# Patient Record
Sex: Male | Born: 1945 | Hispanic: Yes | Marital: Married | Smoking: Former smoker
Health system: Southern US, Community
[De-identification: ages and names within clinical notes are randomized; demographics above are authoritative.]

## PROBLEM LIST (undated history)

## (undated) HISTORY — PX: NO PAST SURGERIES: SHX2092

---

## 2019-01-04 DIAGNOSIS — I10 Essential (primary) hypertension: Secondary | ICD-10-CM | POA: Insufficient documentation

## 2019-01-04 DIAGNOSIS — E1165 Type 2 diabetes mellitus with hyperglycemia: Secondary | ICD-10-CM

## 2019-01-04 HISTORY — DX: Type 2 diabetes mellitus with hyperglycemia: E11.65

## 2019-01-04 HISTORY — DX: Essential (primary) hypertension: I10

## 2019-01-05 DIAGNOSIS — I2699 Other pulmonary embolism without acute cor pulmonale: Secondary | ICD-10-CM

## 2019-01-05 HISTORY — DX: Other pulmonary embolism without acute cor pulmonale: I26.99

## 2019-01-07 DIAGNOSIS — I4891 Unspecified atrial fibrillation: Secondary | ICD-10-CM

## 2019-01-07 DIAGNOSIS — I5020 Unspecified systolic (congestive) heart failure: Secondary | ICD-10-CM

## 2019-01-07 HISTORY — DX: Unspecified systolic (congestive) heart failure: I50.20

## 2019-01-07 HISTORY — DX: Unspecified atrial fibrillation: I48.91

## 2019-01-20 DIAGNOSIS — E7800 Pure hypercholesterolemia, unspecified: Secondary | ICD-10-CM | POA: Insufficient documentation

## 2019-01-20 DIAGNOSIS — R918 Other nonspecific abnormal finding of lung field: Secondary | ICD-10-CM | POA: Insufficient documentation

## 2019-01-20 DIAGNOSIS — I714 Abdominal aortic aneurysm, without rupture, unspecified: Secondary | ICD-10-CM

## 2019-01-20 HISTORY — DX: Abdominal aortic aneurysm, without rupture, unspecified: I71.40

## 2019-01-20 HISTORY — DX: Abdominal aortic aneurysm, without rupture: I71.4

## 2019-01-20 HISTORY — DX: Other nonspecific abnormal finding of lung field: R91.8

## 2019-01-20 HISTORY — DX: Pure hypercholesterolemia, unspecified: E78.00

## 2019-01-21 DIAGNOSIS — Z5989 Other problems related to housing and economic circumstances: Secondary | ICD-10-CM

## 2019-01-21 DIAGNOSIS — G4700 Insomnia, unspecified: Secondary | ICD-10-CM | POA: Insufficient documentation

## 2019-01-21 HISTORY — DX: Insomnia, unspecified: G47.00

## 2019-01-21 HISTORY — DX: Other problems related to housing and economic circumstances: Z59.89

## 2019-02-09 DIAGNOSIS — Q231 Congenital insufficiency of aortic valve: Secondary | ICD-10-CM | POA: Insufficient documentation

## 2019-02-09 DIAGNOSIS — I350 Nonrheumatic aortic (valve) stenosis: Secondary | ICD-10-CM | POA: Insufficient documentation

## 2019-02-09 HISTORY — DX: Nonrheumatic aortic (valve) stenosis: I35.0

## 2019-02-09 HISTORY — DX: Congenital insufficiency of aortic valve: Q23.1

## 2019-04-21 DIAGNOSIS — Z754 Unavailability and inaccessibility of other helping agencies: Secondary | ICD-10-CM

## 2019-04-21 HISTORY — DX: Unavailability and inaccessibility of other helping agencies: Z75.4

## 2019-06-18 DIAGNOSIS — R600 Localized edema: Secondary | ICD-10-CM

## 2019-06-18 DIAGNOSIS — I509 Heart failure, unspecified: Secondary | ICD-10-CM

## 2019-10-27 DIAGNOSIS — J9601 Acute respiratory failure with hypoxia: Secondary | ICD-10-CM | POA: Insufficient documentation

## 2019-10-27 HISTORY — DX: Acute respiratory failure with hypoxia: J96.01

## 2019-10-29 DIAGNOSIS — I509 Heart failure, unspecified: Secondary | ICD-10-CM

## 2019-10-29 DIAGNOSIS — I160 Hypertensive urgency: Secondary | ICD-10-CM | POA: Insufficient documentation

## 2019-10-29 HISTORY — DX: Heart failure, unspecified: I50.9

## 2019-10-29 HISTORY — DX: Hypertensive urgency: I16.0

## 2020-04-25 ENCOUNTER — Telehealth: Payer: Self-pay | Admitting: Podiatry

## 2020-05-22 ENCOUNTER — Ambulatory Visit: Payer: 59 | Admitting: Cardiovascular Disease

## 2020-05-26 ENCOUNTER — Other Ambulatory Visit: Payer: Self-pay

## 2020-05-26 ENCOUNTER — Ambulatory Visit (INDEPENDENT_AMBULATORY_CARE_PROVIDER_SITE_OTHER): Payer: 59

## 2020-05-26 ENCOUNTER — Ambulatory Visit (INDEPENDENT_AMBULATORY_CARE_PROVIDER_SITE_OTHER): Payer: 59 | Admitting: Podiatry

## 2020-05-26 DIAGNOSIS — Q828 Other specified congenital malformations of skin: Secondary | ICD-10-CM | POA: Diagnosis not present

## 2020-05-26 DIAGNOSIS — L989 Disorder of the skin and subcutaneous tissue, unspecified: Secondary | ICD-10-CM | POA: Diagnosis not present

## 2020-05-26 DIAGNOSIS — M79671 Pain in right foot: Secondary | ICD-10-CM | POA: Diagnosis not present

## 2020-05-26 DIAGNOSIS — R609 Edema, unspecified: Secondary | ICD-10-CM | POA: Diagnosis not present

## 2020-05-26 DIAGNOSIS — M79672 Pain in left foot: Secondary | ICD-10-CM

## 2020-05-26 NOTE — Progress Notes (Signed)
Subjective:   Patient ID: Derek Schroeder, male   DOB: 74 y.o.   MRN: [id number]   HPI 74 year old male presents the office today with his wife as well as an interpreter via iPad for concerns of multiple skin lesions on both of his feet which is been ongoing for several years. He states that the areas will be trimmed and then come back. When he was living in [place] he had some kind of injection performed but not sure of what. He said no recent treatment. He is diabetic but does not check his blood sugar at home. He is also on Eliquis currently. He has had some swelling to the left foot for quite some time without any associated pain. No injury. He did see his cardiologist for this.   Review of Systems  All other systems reviewed and are negative.  No past medical history on file.  Current Outpatient Medications:  .  esomeprazole (NEXIUM) 20 MG capsule, Take by mouth., Disp: , Rfl:  .  hydrALAZINE (APRESOLINE) 25 MG tablet, TOME 2 TABLETAS POR LA BOCA TRES VECES AL DIA, Disp: , Rfl:  .  metFORMIN (GLUCOPHAGE) 1000 MG tablet, TOME 1 TABLETA POR LA BOCA 2 VECES AL DIA CON EL DESAYUNO Y LA CENA., Disp: , Rfl:  .  spironolactone (ALDACTONE) 25 MG tablet, TOME 1/2 TABLETA POR LA BOCA DIARIA., Disp: , Rfl:  .  amLODipine (NORVASC) 5 MG tablet, Take 5 mg by mouth daily., Disp: , Rfl:  .  atorvastatin (LIPITOR) 40 MG tablet, Take 40 mg by mouth daily., Disp: , Rfl:  .  carvedilol (COREG) 12.5 MG tablet, Take 12.5 mg by mouth 2 (two) times daily., Disp: , Rfl:  .  ELIQUIS 5 MG TABS tablet, Take 5 mg by mouth 2 (two) times daily., Disp: , Rfl:  .  gabapentin (NEURONTIN) 300 MG capsule, Take by mouth., Disp: , Rfl:  .  hydrOXYzine (ATARAX/VISTARIL) 25 MG tablet, Take 25 mg by mouth 2 (two) times daily., Disp: , Rfl:  .  loratadine (CLARITIN) 10 MG tablet, Take 10 mg by mouth daily., Disp: , Rfl:  .  losartan (COZAAR) 50 MG tablet, Take 100 mg by mouth daily., Disp: , Rfl:  .  Potassium Chloride  ER 20 MEQ TBCR, Take 1 tablet by mouth 2 (two) times daily., Disp: , Rfl:  .  traZODone (DESYREL) 100 MG tablet, Take 100 mg by mouth at bedtime., Disp: , Rfl:   No Known Allergies       Objective:  Physical Exam  General: AAO x3, NAD  Dermatological: Multiple, small, punctate lesions present lateral aspects of bilateral feet as well as left heel. Upon debridement there is no underlying ulceration drainage or signs of infection. There is about 3 lesions on the right foot and 4 on the left foot. There is no underlying evidence of foreign body, drainage or pus. There is does not appear to be a verruca currently.  Vascular: Dorsalis Pedis artery and Posterior Tibial artery pedal pulses are 2/4 bilateral with immedate capillary fill time. There is no pain with calf compression, swelling, warmth, erythema.   Neruologic: Grossly intact via light touch bilateral.   Musculoskeletal: Mild tenderness to hyperkeratotic lesions but no other areas of tenderness. There is no pain associated with swelling of the foot. Muscular strength 5/5 in all groups tested bilateral.  Gait: Unassisted, Nonantalgic.       Assessment:   Porokeratosis, skin lesions bilaterally; left foot swelling     Plan:  -  Treatment options discussed including all alternatives, risks, and complications -Etiology of symptoms were discussed -Lesions were debrided without any complications or bleeding. I recommended moisturizer dispensed miracle foot cream. -Discussed importance of daily foot inspection as well as to check his glucose at home. -X-rays ordered of left foot.  Vivi Barrack DPM

## 2020-06-07 NOTE — Telephone Encounter (Signed)
DONE

## 2020-06-09 ENCOUNTER — Other Ambulatory Visit: Payer: Self-pay

## 2020-06-09 ENCOUNTER — Encounter: Payer: Self-pay | Admitting: Cardiology

## 2020-06-09 ENCOUNTER — Encounter: Payer: Self-pay | Admitting: *Deleted

## 2020-06-09 ENCOUNTER — Ambulatory Visit (INDEPENDENT_AMBULATORY_CARE_PROVIDER_SITE_OTHER): Payer: 59 | Admitting: Cardiology

## 2020-06-09 VITALS — BP 120/70 | HR 67 | Ht 66.0 in | Wt 227.0 lb

## 2020-06-09 DIAGNOSIS — I4891 Unspecified atrial fibrillation: Secondary | ICD-10-CM

## 2020-06-09 DIAGNOSIS — I4821 Permanent atrial fibrillation: Secondary | ICD-10-CM

## 2020-06-09 DIAGNOSIS — I5033 Acute on chronic diastolic (congestive) heart failure: Secondary | ICD-10-CM | POA: Diagnosis not present

## 2020-06-09 DIAGNOSIS — E1165 Type 2 diabetes mellitus with hyperglycemia: Secondary | ICD-10-CM | POA: Diagnosis not present

## 2020-06-09 DIAGNOSIS — E669 Obesity, unspecified: Secondary | ICD-10-CM

## 2020-06-09 DIAGNOSIS — I714 Abdominal aortic aneurysm, without rupture, unspecified: Secondary | ICD-10-CM

## 2020-06-09 HISTORY — DX: Permanent atrial fibrillation: I48.21

## 2020-06-09 HISTORY — DX: Obesity, unspecified: E66.9

## 2020-06-09 NOTE — Patient Instructions (Addendum)
Medication Instructions:  No medication changes. *If you need a refill on your cardiac medications before your next appointment, please call your pharmacy*   Lab Work: None ordered If you have labs (blood work) drawn today and your tests are completely normal, you will receive your results only by: Marland Kitchen MyChart Message (if you have MyChart) OR . A paper copy in the mail If you have any lab test that is abnormal or we need to change your treatment, we will call you to review the results.   Testing/Procedures: Your physician has requested that you have an abdominal aorta duplex. During this test, an ultrasound is used to evaluate the aorta. Allow 30 minutes for this exam. Do not eat after midnight the day before and avoid carbonated beverages   Follow-Up: At [facility], you and your health needs are our priority.  As part of our continuing mission to provide you with exceptional heart care, we have created designated Provider Care Teams.  These Care Teams include your primary Cardiologist (physician) and Advanced Practice Providers (APPs -  Physician Assistants and Nurse Practitioners) who all work together to provide you with the care you need, when you need it.  We recommend signing up for the patient portal called "MyChart".  Sign up information is provided on this After Visit Summary.  MyChart is used to connect with patients for Virtual Visits (Telemedicine).  Patients are able to view lab/test results, encounter notes, upcoming appointments, etc.  Non-urgent messages can be sent to your provider as well.   To learn more about what you can do with MyChart, go to [email or web address].    Your next appointment:   6 month(s)  The format for your next appointment:   In Person  Provider:   Belva Crome, MD   Other Instructions

## 2020-06-09 NOTE — Progress Notes (Signed)
Cardiology Office Note:    Date:  06/09/2020   ID:  Derek Schroeder, DOB [birth year], MRN [id number]  PCP:  Nechama Guard, FNP  Cardiologist:  Garwin Brothers, MD   Referring MD: Nechama Guard, FNP    ASSESSMENT:    1. Atrial fibrillation, unspecified type (HCC)   2. Permanent atrial fibrillation (HCC)   3. Acute on chronic diastolic congestive heart failure (HCC)   4. Type 2 diabetes mellitus with hyperglycemia, without long-term current use of insulin (HCC)   5. Abdominal aortic aneurysm (AAA) without rupture (HCC)   6. Obesity (BMI 35.0-39.9 without comorbidity)    PLAN:    In order of problems listed above:  1. Primary prevention stressed with patient.  Importance of compliance with diet medication stressed any vocalized understanding. 2. Permanent atrial fibrillation:I discussed with the patient atrial fibrillation, disease process. Management and therapy including rate and rhythm control, anticoagulation benefits and potential risks were discussed extensively with the patient. Patient had multiple questions which were answered to patient's satisfaction. 3. Preserved systolic function heart failure: Patient is on appropriate medication and he is monitoring his weight very regularly.  Salt intake issues and regular weight checks were emphasized.  Patient had official interpreter who translated all these messages.  His wife accompanies him to visit and had multiple questions which were answered to satisfaction. 4. Essential hypertension: Blood pressure is stable 5. Mixed dyslipidemia: Diet was emphasized.  Weight reduction was stressed.  Lipids were reviewed.  This will be followed by his primary care physician. 6. Abdominal aortic aneurysm: Records reviewed and we will do a Doppler to assess and follow-up on this finding.  I discussed this with him at length and questions were answered to satisfaction. 7. Patient will be seen in follow-up appointment in 6 months or  earlier if the patient has any concerns    Medication Adjustments/Labs and Tests Ordered: Current medicines are reviewed at length with the patient today.  Concerns regarding medicines are outlined above.  No orders of the defined types were placed in this encounter.  No orders of the defined types were placed in this encounter.    History of Present Illness:    Derek Schroeder is a 74 y.o. male who is being seen today for the evaluation of history of congestive heart failure and atrial fibrillation at the request of Ntibrey, Mabeline Caras, FNP.Marland Kitchen  Patient mentions to me the history of congestive heart failure.  His echocardiogram done fairly recently was unremarkable.  He has permanent atrial fibrillation is on anticoagulation.  His abdominal aneurysm of the aorta and hypertension and mixed dyslipidemia.  He is obese.  He leads a sedentary lifestyle.  No chest pain orthopnea or PND.  For insurance reason he has transferred to our care from the [facility] cardiology group.  At the time of my evaluation, the patient is alert awake oriented and in no distress.  Past Medical History:  Diagnosis Date  . Abdominal aortic aneurysm (AAA) without rupture (HCC) 01/20/2019  . Acute hypoxemic respiratory failure (HCC) 10/27/2019  . Atrial fibrillation (HCC) 01/07/2019  . Bicuspid aortic valve 02/09/2019  . CHF exacerbation (HCC) 10/29/2019  . HTN (hypertension) 01/04/2019  . Hypertensive urgency 10/29/2019  . Insomnia 01/21/2019  . 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 Pulmonary embolism on right (HCC) 01/05/2019  . Pulmonary nodules 01/20/2019  . Pure hypercholesterolemia 01/20/2019  . Systolic heart failure (HCC) 01/07/2019  . Type 2 diabetes mellitus with hyperglycemia, without long-term current use  of insulin (HCC) 01/04/2019    Current Medications: Current Meds  Medication Sig  . amLODipine (NORVASC) 5 MG tablet Take 5 mg by mouth daily.  Marland Kitchen atorvastatin (LIPITOR) 40 MG tablet Take 40 mg by  mouth daily.  . carvedilol (COREG) 12.5 MG tablet Take 12.5 mg by mouth 2 (two) times daily.  Marland Kitchen ELIQUIS 5 MG TABS tablet Take 5 mg by mouth 2 (two) times daily.  Marland Kitchen esomeprazole (NEXIUM) 20 MG capsule Take by mouth.  . furosemide (LASIX) 80 MG tablet Take 80 mg by mouth 2 (two) times daily.  Marland Kitchen losartan (COZAAR) 50 MG tablet Take 100 mg by mouth daily.  Marland Kitchen MELATONIN GUMMIES PO Take by mouth at bedtime as needed.  . metFORMIN (GLUCOPHAGE) 1000 MG tablet Take 1,000 mg by mouth 2 (two) times daily with a meal.   . Potassium Chloride ER 20 MEQ TBCR Take 1 tablet by mouth 2 (two) times daily.     Allergies:   Patient has no known allergies.   Social History   Socioeconomic History  . Marital status: Married    Spouse name: Not on file  . Number of children: Not on file  . Years of education: Not on file  . Highest education level: Not on file  Occupational History  . Not on file  Tobacco Use  . Smoking status: Former Smoker    Types: Cigarettes    Quit date: 06/09/2016    Years since quitting: 4.0  . Smokeless tobacco: Never Used  Substance and Sexual Activity  . Alcohol use: Never  . Drug use: Never  . Sexual activity: Not on file  Other Topics Concern  . Not on file  Social History Narrative  . Not on file   Social Determinants of Health   Financial Resource Strain:   . Difficulty of Paying Living Expenses: Not on file  Food Insecurity:   . Worried About Programme researcher, broadcasting/film/video in the Last Year: Not on file  . Ran Out of Food in the Last Year: Not on file  Transportation Needs:   . Lack of Transportation (Medical): Not on file  . Lack of Transportation (Non-Medical): Not on file  Physical Activity:   . Days of Exercise per Week: Not on file  . Minutes of Exercise per Session: Not on file  Stress:   . Feeling of Stress : Not on file  Social Connections:   . Frequency of Communication with Friends and Family: Not on file  . Frequency of Social Gatherings with Friends and  Family: Not on file  . Attends Religious Services: Not on file  . Active Member of Clubs or Organizations: Not on file  . Attends Banker Meetings: Not on file  . Marital Status: Not on file     Family History: The patient's family history includes Diabetes in his father.  ROS:   Please see the history of present illness.    All other systems reviewed and are negative.  EKGs/Labs/Other Studies Reviewed:    The following studies were reviewed today: SUMMARY  Aortic valve calcification  The left ventricular wall motion is normal.  LV ejection fraction = 55-60%.  Mild left ventricular hypertrophy  There is mild to moderate aortic regurgitation.  There is mild mitral regurgitation.  There is mild tricuspid regurgitation.  Mild pulmonary hypertension.  There is no significant change in comparison with the last study  report 06/08/2019     Recent Labs: No results found for requested  labs within last 8760 hours.  Recent Lipid Panel No results found for: CHOL, TRIG, HDL, CHOLHDL, VLDL, LDLCALC, LDLDIRECT  Physical Exam:    VS:  BP 120/70 (BP Location: Right Arm, Patient Position: Sitting, Cuff Size: Normal)   Pulse 67   Ht 5\' 6"  (1.676 m)   Wt 227 lb (103 kg)   SpO2 97%   BMI 36.64 kg/m     Wt Readings from Last 3 Encounters:  06/09/20 227 lb (103 kg)     GEN: Patient is in no acute distress HEENT: Normal NECK: No JVD; No carotid bruits LYMPHATICS: No lymphadenopathy CARDIAC: S1 S2 regular, 2/6 systolic murmur at the apex. RESPIRATORY:  Clear to auscultation without rales, wheezing or rhonchi  ABDOMEN: Soft, non-tender, non-distended MUSCULOSKELETAL:  No edema; No deformity  SKIN: Warm and dry NEUROLOGIC:  Alert and oriented x 3 PSYCHIATRIC:  Normal affect    Signed, 08/09/20, MD  06/09/2020 10:30 AM    [facility]

## 2020-06-14 ENCOUNTER — Telehealth: Payer: Self-pay

## 2020-06-14 NOTE — Telephone Encounter (Signed)
Pt assistance completed for Eliquis.

## 2020-07-03 ENCOUNTER — Ambulatory Visit (HOSPITAL_BASED_OUTPATIENT_CLINIC_OR_DEPARTMENT_OTHER): Payer: 59

## 2020-08-19 DIAGNOSIS — I4811 Longstanding persistent atrial fibrillation: Secondary | ICD-10-CM

## 2020-08-19 DIAGNOSIS — J208 Acute bronchitis due to other specified organisms: Secondary | ICD-10-CM

## 2020-08-19 DIAGNOSIS — I10 Essential (primary) hypertension: Secondary | ICD-10-CM | POA: Insufficient documentation

## 2020-08-19 DIAGNOSIS — I5032 Chronic diastolic (congestive) heart failure: Secondary | ICD-10-CM

## 2020-08-19 DIAGNOSIS — B9781 Human metapneumovirus as the cause of diseases classified elsewhere: Secondary | ICD-10-CM

## 2020-08-19 HISTORY — DX: Chronic diastolic (congestive) heart failure: I50.32

## 2020-08-19 HISTORY — DX: Longstanding persistent atrial fibrillation: I48.11

## 2020-08-19 HISTORY — DX: Essential (primary) hypertension: I10

## 2020-08-19 HISTORY — DX: Acute bronchitis due to other specified organisms: J20.8

## 2020-08-19 HISTORY — DX: Human metapneumovirus as the cause of diseases classified elsewhere: B97.81

## 2020-12-12 ENCOUNTER — Ambulatory Visit: Payer: 59 | Admitting: Cardiology

## 2020-12-19 ENCOUNTER — Encounter: Payer: Self-pay | Admitting: Cardiology

## 2020-12-29 ENCOUNTER — Ambulatory Visit (INDEPENDENT_AMBULATORY_CARE_PROVIDER_SITE_OTHER): Payer: 59 | Admitting: Podiatry

## 2020-12-29 ENCOUNTER — Encounter: Payer: Self-pay | Admitting: Podiatry

## 2020-12-29 ENCOUNTER — Other Ambulatory Visit: Payer: Self-pay

## 2020-12-29 DIAGNOSIS — M79671 Pain in right foot: Secondary | ICD-10-CM | POA: Diagnosis not present

## 2020-12-29 DIAGNOSIS — Q828 Other specified congenital malformations of skin: Secondary | ICD-10-CM | POA: Diagnosis not present

## 2020-12-29 DIAGNOSIS — M79672 Pain in left foot: Secondary | ICD-10-CM

## 2020-12-29 DIAGNOSIS — Z7901 Long term (current) use of anticoagulants: Secondary | ICD-10-CM | POA: Diagnosis not present

## 2021-01-02 NOTE — Progress Notes (Signed)
Subjective: 75 year old male presents the office with interpreter for evaluation of calluses.  He states that that miracle foot cream has been working very well and he has been doing great up into the last month and the calluses started to come back causing discomfort.  Denies any open lesions. Denies any systemic complaints such as fevers, chills, nausea, vomiting. No acute changes since last appointment, and no other complaints at this time.   He is currently taking Eliquis  Objective: AAO x3, NAD DP/PT pulses palpable bilaterally, CRT less than 3 seconds Hyperkeratotic lesions noted on both the left and right foot.  There are multiple small annular hyperkeratotic lesions there are 3 lesions on each foot.  Upon debridement very minimal amount of bleeding occurred on the left plantar lateral lesion.  There is no edema no erythema or signs of infection. No pain with calf compression, swelling, warmth, erythema  Assessment: Hyperkeratotic lesions, currently on Eliquis  Plan: -All treatment options discussed with the patient including all alternatives, risks, complications.  -Sharply debrided the hyperkeratotic lesions x6.  Small mount of bleeding occurred on the left side.  The area was cleaned and antibiotic ointment was applied.  Monitor for any signs or symptoms of infection.  Continue offloading as well as moisturizer.  Apply the moisturizer once the area of the left side is completely healed. -Patient encouraged to call the office with any questions, concerns, change in symptoms.   Vivi Barrack DPM

## 2021-02-08 ENCOUNTER — Ambulatory Visit: Payer: 59 | Admitting: Cardiology

## 2021-02-08 ENCOUNTER — Encounter: Payer: Self-pay | Admitting: Cardiology

## 2021-02-08 ENCOUNTER — Telehealth: Payer: Self-pay | Admitting: General Practice

## 2021-02-08 ENCOUNTER — Other Ambulatory Visit: Payer: Self-pay

## 2021-02-08 VITALS — BP 126/74 | HR 74 | Ht 67.0 in | Wt 221.1 lb

## 2021-02-08 DIAGNOSIS — E669 Obesity, unspecified: Secondary | ICD-10-CM | POA: Diagnosis not present

## 2021-02-08 DIAGNOSIS — I714 Abdominal aortic aneurysm, without rupture, unspecified: Secondary | ICD-10-CM

## 2021-02-08 DIAGNOSIS — R0600 Dyspnea, unspecified: Secondary | ICD-10-CM

## 2021-02-08 DIAGNOSIS — I4821 Permanent atrial fibrillation: Secondary | ICD-10-CM | POA: Diagnosis not present

## 2021-02-08 DIAGNOSIS — E1165 Type 2 diabetes mellitus with hyperglycemia: Secondary | ICD-10-CM | POA: Diagnosis not present

## 2021-02-08 DIAGNOSIS — R0609 Other forms of dyspnea: Secondary | ICD-10-CM

## 2021-02-08 HISTORY — DX: Dyspnea, unspecified: R06.00

## 2021-02-08 HISTORY — DX: Other forms of dyspnea: R06.09

## 2021-02-08 NOTE — Patient Instructions (Addendum)
Medication Instructions:  Your physician recommends that you continue on your current medications as directed. Please refer to the Current Medication list given to you today.  *If you need a refill on your cardiac medications before your next appointment, please call your pharmacy*   Lab Work: Your physician recommends that you have a BMET done today in the office.  If you have labs (blood work) drawn today and your tests are completely normal, you will receive your results only by: Marland Kitchen MyChart Message (if you have MyChart) OR . A paper copy in the mail If you have any lab test that is abnormal or we need to change your treatment, we will call you to review the results.   Testing/Procedures: Your physician has requested that you have a lexiscan myoview. For further information please visit [email or web address] Please follow instruction sheet, as given.  The test will take approximately 3 to 4 hours to complete; you may bring reading material.  If someone comes with you to your appointment, they will need to remain in the main lobby due to limited space in the testing area.  How to prepare for your Myocardial Perfusion Test: . Do not eat or drink 3 hours prior to your test, except you may have water. . Do not consume products containing caffeine (regular or decaffeinated) 12 hours prior to your test. (ex: coffee, chocolate, sodas, tea). . Do bring a list of your current medications with you.  If not listed below, you may take your medications as normal. . Do wear comfortable clothes (no dresses or overalls) and walking shoes, tennis shoes preferred (No heels or open toe shoes are allowed). . Do NOT wear cologne, perfume, aftershave, or lotions (deodorant is allowed). . If these instructions are not followed, your test will have to be rescheduled.   Su mdico ha solicitado que se Airline pilot. Para obtener ms informacin, visite [email or web address] Siga la hoja de  instrucciones, tal como se indica.  La prueba tardar aproximadamente de 3 a 4 horas en completarse; puede traer material de lectura. Si alguien lo acompaa a su cita, Education officer, environmental en el vestbulo principal debido al espacio limitado en el rea de prueba.   Cmo prepararse para su prueba de perfusin miocrdica: . No coma ni beba 3 horas antes de su prueba, excepto que tenga agua. . No consuma productos que contengan cafena (regular o descafeinada) 12 horas antes de su prueba. (Ej: caf, chocolate, refrescos, t). . Do bring a list of your current medications with you.  If not listed below, you may take your medications as normal. "Traiga una lista de sus medicamentos actuales. Si no se encuentran en la lista a continuacin, puede tomar sus medicamentos con normalidad". . Use ropa cmoda (sin vestidos ni overoles) y zapatos para caminar, preferiblemente tenis (no se permiten tacones ni zapatos abiertos). . NO use colonia, perfume, locin para despus del afeitado o lociones (se permite el desodorante). . Si no se siguen estas instrucciones, su prueba tendr que ser reprogramada.  Your physician has requested that you have an echocardiogram. Echocardiography is a painless test that uses sound waves to create images of your heart. It provides your doctor with information about the size and shape of your heart and how well your heart's chambers and valves are working. This procedure takes approximately one hour. There are no restrictions for this procedure.  Su mdico ha solicitado que se Engineer, manufacturing. La ecocardiografa es una prueba indolora que [place] ondas de sonido  para crear imgenes de su corazn. Le brinda a su mdico informacin sobre el tamao y la forma de su corazn y qu tan bien estn funcionando las cmaras y vlvulas de su corazn. Este procedimiento dura aproximadamente una hora. No hay restricciones para este procedimiento.  Follow-Up: At [facility], you and your  health needs are our priority.  As part of our continuing mission to provide you with exceptional heart care, we have created designated Provider Care Teams.  These Care Teams include your primary Cardiologist (physician) and Advanced Practice Providers (APPs -  Physician Assistants and Nurse Practitioners) who all work together to provide you with the care you need, when you need it.  We recommend signing up for the patient portal called "MyChart".  Sign up information is provided on this After Visit Summary.  MyChart is used to connect with patients for Virtual Visits (Telemedicine).  Patients are able to view lab/test results, encounter notes, upcoming appointments, etc.  Non-urgent messages can be sent to your provider as well.   To learn more about what you can do with MyChart, go to [email or web address].    Your next appointment:   3 month(s)  The format for your next appointment:   In Person  Provider:   Belva Crome, MD   Other Instructions   Ecocardiograma Echocardiogram Un ecocardiograma es una prueba que [place] ondas sonoras (ecografa) para obtener imgenes del corazn. Las imgenes de un ecocardiograma pueden proporcionar informacin importante sobre lo siguiente:  Tamao y forma del corazn.  El [place], el grosor y el movimiento de las paredes del Programmer, multimedia.  La funcin y la fuerza del msculo cardaco.  La funcin de la vlvula cardaca o si tiene estenosis. Se presenta estenosis cuando las vlvulas cardacas son demasiado estrechas.  Si la sangre [employer] atrs a travs de las vlvulas cardacas (regurgitacin).  Un tumor o crecimiento infeccioso alrededor de las vlvulas cardacas.  reas del msculo cardaco que no funcionan bien debido a un flujo sanguneo deficiente o a una lesin a causa de un infarto de miocardio.  Signos de aneurisma. Un aneurisma es una parte debilitada o daada en la pared de una arteria. La pared se abulta debido a la fuerza normal  que produce el bombeo de la sangre a travs del cuerpo. Informe al mdico acerca de lo siguiente:  Cualquier alergia que tenga.  Todos los [employer] [place], incluidos vitaminas, hierbas, gotas oftlmicas, cremas y [address] de [address].  Cualquier trastorno de la sangre que tenga.  Cirugas a las que se haya sometido.  Cualquier afeccin mdica que tenga.  Si est embarazada o podra estarlo. Cules son los riesgos? Por lo general, se trata de una prueba segura. Sin embargo, pueden presentarse problemas, como una reaccin alrgica al tinte Investment banker, operational) que se puede usar durante la prueba. Qu ocurre antes de esta prueba? No se requiere Lobbyist. Podr comer y beber normalmente. Qu ocurre durante la prueba?  Se quitar la ropa de la cintura para [place] y se pondr una bata de hospital.  Es posible que le coloquen electrodos oparches de electrocardiograma (ECG) en el pecho. Luego los electrodos o parches se conectan a un dispositivo que controla su ritmo y frecuencia cardaca.  Se acostar sobre una mesa para que le realicen una ecografa. Se le aplicar un gel en el pecho para ayudar a que las ondas sonoras pasen a travs de la piel.  Se presionar un dispositivo de mano, llamado transductor, contra el pecho y se  mover sobre su corazn. El transductor produce ondas sonoras que viajan hasta el corazn y rebotan (o producen "eco") Air cabin crewhasta el transductor. Estas ondas sonoras se captarn en tiempo real y se transformarn en imgenes del corazn que se pueden ver en un monitor de vdeo. Las imgenes se grabarn en una computadora y sern revisadas por el mdico.  Podrn solicitarle que cambie de posicin o que contenga la respiracin durante un lapso breve. Esto hace que sea ms fcil obtener diferentes vistas o mejores vistas del corazn.  En algunos casos, puede recibir Lincolndalecontraste a travs de una va intravenosa (i.v). en una de las venas. Esto puede mejorar la  calidad de las imgenes del corazn. El procedimiento puede variar segn el mdico y el hospital.   Qu puedo esperar despus de la prueba? Puede retomar su rutina diaria normal, incluidos la dieta, las actividades y Pulte Homeslos medicamentos, a menos que su mdico le indique lo contrario. Siga estas instrucciones en su casa:  Recoger los Norfolk Southernresultados de la prueba es su responsabilidad. Consulte al mdico o pregunte en el departamento donde se realiza la prueba cundo estarn Hexion Specialty Chemicalslistos los resultados.  Cumpla con todas las visitas de seguimiento. Esto es importante. Resumen  Un ecocardiograma es una prueba que [place] ondas sonoras (ecografa) para obtener imgenes del corazn.  Las Leggett & Plattimgenes de un ecocardiograma pueden brindar informacin importante sobre el tamao y la forma del corazn, la funcin del msculo cardaco, la funcin de la vlvula cardaca y otros posibles problemas cardacos.  No es necesario prepararse para esta prueba. Podr comer y beber normalmente.  Al finalizar el ecocardiograma, puede retomar su rutina diaria normal, a menos que su mdico le indique lo contrario. Esta informacin no tiene Theme park managercomo fin reemplazar el consejo del mdico. Asegrese de hacerle al mdico cualquier pregunta que tenga. Document Revised: 06/22/2020 Document Reviewed: 06/22/2020 Elsevier Patient Education  2021 Elsevier Inc.  DravosburgGammagrafa cardaca Cardiac Nuclear Scan Kerby LessUna gammagrafa cardaca es una prueba que se realiza para verificar el flujo de sangre hacia el corazn. Se realiza cuando est en reposo y cuando hace ejercicio. La prueba puede detectar si:  No llega suficiente sangre a una regin del corazn.  El msculo cardaco no funciona como debera. Puede ser Temple-Inlandnecesario que le hagan esta prueba si:  Shelle Ironiene una enfermedad cardaca.  Ha tenido resultados de laboratorio que no son normales.  Ha tenido una ciruga cardaca o un procedimiento de baln para abrir las arterias obstruidas  (angioplastia).  Siente dolor en el pecho.  Le falta el aire. Para esta prueba, se pone un tinte especial (marcador) en el torrente sanguneo. El Holiday representativemarcador llegar hasta el corazn. Luego una cmara tomar imgenes del corazn para ver cmo se Engineer, technical salesdesplaza el marcador a travs del corazn. Generalmente, esta prueba se realiza en un hospital y [place] entre 2 y 4horas. Informe al mdico acerca de:  Cualquier alergia que tenga.  Todos los Walt Disneymedicamentos que utiliza, incluidos vitaminas, hierbas, gotas oftlmicas, cremas y [address]icamentos de [address] libre.  Cualquier problema previo que usted o algn miembro de su familia haya tenido con los anestsicos.  Cualquier trastorno de la sangre que tenga.  Cirugas a las que se haya sometido.  Cualquier afeccin mdica que tenga.  Si est embarazada o podra estarlo. Cules son los riesgos? Por lo general, se trata de un estudio seguro. Sin embargo, pueden presentarse problemas, por ejemplo:  Dolor intenso en el pecho e infarto de miocardio. Esto es solo un riesgo si se Designer, multimediarealiza la parte de la prueba  de esfuerzo del procedimiento.  Latidos cardacos rpidos.  Una sensacin de Paediatric nurse. Esta sensacin por lo general no dura mucho tiempo.  Una reaccin alrgica al [employer]. Qu ocurre antes de esta prueba?  Consulte al mdico si debe cambiar o suspender sus medicamentos habituales. Esto es importante.  Siga las indicaciones del mdico respecto de lo que no puede comer o beber.  Qutese las [employer] de la prueba. Qu ocurre durante la prueba?  Le colocarn un tubo (catter) intravenoso en una de las venas.  El mdico le administrar una pequea cantidad de Engineer, manufacturing systems a travs del tubo (catter) intravenoso.  Usted esperar durante 20 a mientras el marcador se desplaza por el torrente sanguneo.  Se supervisar el corazn con un electrocardiograma (ECG).  Se recostar en una camilla.  Se tomarn imgenes del corazn  durante alrededor de 15 a .  Tambin pueden hacerle Writer. Para esta prueba, puede realizarse una de estas cosas: ? Se le pedir que ejercite sobre una cinta caminadora o una bicicleta fija. ? Le administrarn medicamentos que harn que su corazn trabaje ms. Esto se realiza si no puede hacer ejercicio.  Cuando el corazn reciba el flujo mximo de [place], se volver a Clinical research associate a travs del tubo (catter) intravenoso.  Despus de 20 a 40 minutos, regresar a la camilla. Se le tomarn ms imgenes del corazn.  Segn el marcador utilizado, es posible que se deban tomar ms imgenes de 3 a 4 horas ms tarde.  Cuando la prueba haya terminado, se retirar el tubo (catter) intravenoso. El estudio puede variar segn el mdico y el hospital. Ladell Heads sucede despus del estudio?  Pregntele al mdico lo siguiente: ? Si puede retomar su rutina normal, incluidos la [address], las actividades y Pulte Homes. ? Si debe tomar ms lquido. Esto ayudar a Event organiser del cuerpo. Beba suficiente lquido para Radio producer pis (la orina) de color amarillo plido.  Consulte al mdico o pregunte en el departamento donde se realiza el estudio: ? Cundo estarn disponibles mis resultados? ? Cmo obtendr mis resultados? Resumen  Una gammagrafa cardaca es una prueba que se realiza para verificar el flujo de sangre hacia el corazn.  Informe al mdico si est embarazada o podra estarlo.  Antes de la prueba, consulte al mdico si debe cambiar o suspender sus medicamentos habituales. Esto es importante.  Consulte al mdico si puede volver a sus [employer]. Es posible que le indiquen que beba ms cantidad de lquidos. Esta informacin no tiene Theme park manager el consejo del mdico. Asegrese de hacerle al mdico cualquier pregunta que tenga. Document Revised: 04/06/2018 Document Reviewed: 04/06/2018 Elsevier Patient Education  2021 Arvin[employer]itor.

## 2021-02-08 NOTE — Progress Notes (Signed)
Cardiology Office Note:    Date:  02/08/2021   ID:  Derek Schroeder, DOB 09/05/1946, MRN [id number]  PCP:  Nechama Guard, FNP  Cardiologist:  Garwin Brothers, MD   Referring MD: Nechama Guard, FNP    ASSESSMENT:    1. Abdominal aortic aneurysm (AAA) without rupture (HCC)   2. Permanent atrial fibrillation (HCC)   3. Obesity (BMI 35.0-39.9 without comorbidity)   4. Type 2 diabetes mellitus with hyperglycemia, without long-term current use of insulin (HCC)   5. DOE (dyspnea on exertion)    PLAN:    In order of problems listed above:  1. Primary prevention stressed with patient.  Importance of compliance with diet medication stressed any vocalized understanding. 2. Permanent atrial fibrillation:I discussed with the patient atrial fibrillation, disease process. Management and therapy including rate and rhythm control, anticoagulation benefits and potential risks were discussed extensively with the patient. Patient had multiple questions which were answered to patient's satisfaction. 3. Dyspnea on exertion: I want to make sure that this is not from a coronary etiology.  We will have Lexiscan sestamibi evaluation and he is agreeable. 4. Cardiac murmur: Echocardiogram will be done to assess murmur heard on auscultation. 5. Bilateral pedal edema: Measures such as decreasing salt diet was emphasized.  Compression stockings was emphasized 6. He will have a Chem-7 today. 7. Patient will be seen in follow-up appointment in 6 months or earlier if the patient has any concerns 8. Essential hypertension and diabetes mellitus: Managed by primary care.  Lifestyle modification urged.   Medication Adjustments/Labs and Tests Ordered: Current medicines are reviewed at length with the patient today.  Concerns regarding medicines are outlined above.  No orders of the defined types were placed in this encounter.  No orders of the defined types were placed in this encounter.    No chief  complaint on file.    History of Present Illness:    Derek Schroeder is a 75 y.o. male.  Patient has past medical history atrial fibrillation,, essential hypertension, dyslipidemia and diabetes mellitus.  His problem shortness of breath on exertion.  He takes anticoagulation on a regular basis.  He also mentions to me that he has significant pedal edema.  He takes his diuretic on a regular basis.  At the time of my evaluation, the patient is alert awake oriented and in no distress.  Past Medical History:  Diagnosis Date  . Abdominal aortic aneurysm (AAA) without rupture (HCC) 01/20/2019  . Acute bronchitis due to human metapneumovirus 08/19/2020  . Acute hypoxemic respiratory failure (HCC) 10/27/2019  . Atrial fibrillation (HCC) 01/07/2019  . Bicuspid aortic valve 02/09/2019  . CHF exacerbation (HCC) 10/29/2019  . Chronic diastolic heart failure (HCC) 08/19/2020  . HTN (hypertension) 01/04/2019  . Hypertensive urgency 10/29/2019  . Insomnia 01/21/2019  . Longstanding persistent atrial fibrillation (HCC) 08/19/2020  . 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 Obesity (BMI 35.0-39.9 without comorbidity) 06/09/2020  . Permanent atrial fibrillation (HCC) 06/09/2020  . Primary hypertension 08/19/2020  . Pulmonary embolism on right (HCC) 01/05/2019  . Pulmonary nodules 01/20/2019  . Pure hypercholesterolemia 01/20/2019  . Systolic heart failure (HCC) 01/07/2019  . Type 2 diabetes mellitus with hyperglycemia, without long-term current use of insulin (HCC) 01/04/2019  . Unavailability and inaccessibility of other helping agencies 04/21/2019  . Uninsured 01/21/2019    Past Surgical History:  Procedure Laterality Date  . NO PAST SURGERIES      Current Medications: Current Meds  Medication Sig  . amLODipine (  NORVASC) 5 MG tablet Take 5 mg by mouth daily.  Marland Kitchen atorvastatin (LIPITOR) 40 MG tablet Take 40 mg by mouth daily.  . carvedilol (COREG) 12.5 MG tablet Take 12.5 mg by mouth 2 (two) times  daily.  Marland Kitchen ELIQUIS 5 MG TABS tablet Take 5 mg by mouth 2 (two) times daily.  . furosemide (LASIX) 80 MG tablet Take 80 mg by mouth 2 (two) times daily.  Marland Kitchen losartan (COZAAR) 50 MG tablet Take 100 mg by mouth daily.  Marland Kitchen MELATONIN GUMMIES PO Take 1 Piece by mouth at bedtime.  . metFORMIN (GLUCOPHAGE) 1000 MG tablet Take 1,000 mg by mouth 2 (two) times daily with a meal.  . Potassium Chloride ER 20 MEQ TBCR Take 1 tablet by mouth 2 (two) times daily.     Allergies:   Patient has no known allergies.   Social History   Socioeconomic History  . Marital status: Married    Spouse name: Not on file  . Number of children: Not on file  . Years of education: Not on file  . Highest education level: Not on file  Occupational History  . Not on file  Tobacco Use  . Smoking status: Former Smoker    Types: Cigarettes    Quit date: 06/09/2016    Years since quitting: 4.6  . Smokeless tobacco: Never Used  Substance and Sexual Activity  . Alcohol use: Never  . Drug use: Never  . Sexual activity: Not on file  Other Topics Concern  . Not on file  Social History Narrative  . Not on file   Social Determinants of Health   Financial Resource Strain: Not on file  Food Insecurity: Not on file  Transportation Needs: Not on file  Physical Activity: Not on file  Stress: Not on file  Social Connections: Not on file     Family History: The patient's family history includes Diabetes in his father.  ROS:   Please see the history of present illness.    All other systems reviewed and are negative.  EKGs/Labs/Other Studies Reviewed:    The following studies were reviewed today: I discussed my findings with the patient that extensive blood   Recent Labs: No results found for requested labs within last 8760 hours.  Recent Lipid Panel No results found for: CHOL, TRIG, HDL, CHOLHDL, VLDL, LDLCALC, LDLDIRECT  Physical Exam:    VS:  BP 126/74   Pulse 74   Ht 5\' 7"  (1.702 m)   Wt 221 lb 1.9 oz  (100.3 kg)   SpO2 95%   BMI 34.63 kg/m     Wt Readings from Last 3 Encounters:  02/08/21 221 lb 1.9 oz (100.3 kg)  06/09/20 227 lb (103 kg)     GEN: Patient is in no acute distress HEENT: Normal NECK: No JVD; No carotid bruits LYMPHATICS: No lymphadenopathy CARDIAC: Hear sounds regular, 2/6 systolic murmur at the apex. RESPIRATORY:  Clear to auscultation without rales, wheezing or rhonchi  ABDOMEN: Soft, non-tender, non-distended MUSCULOSKELETAL:  No edema; No deformity  SKIN: Warm and dry NEUROLOGIC:  Alert and oriented x 3 PSYCHIATRIC:  Normal affect   Signed, 08/09/20, MD  02/08/2021 11:49 AM    [facility]

## 2021-02-09 LAB — BASIC METABOLIC PANEL
BUN/Creatinine Ratio: 24 (ref 10–24)
BUN: 21 mg/dL (ref 8–27)
CO2: 21 mmol/L (ref 20–29)
Calcium: 9.7 mg/dL (ref 8.6–10.2)
Chloride: 98 mmol/L (ref 96–106)
Creatinine, Ser: 0.88 mg/dL (ref 0.76–1.27)
Glucose: 212 mg/dL — ABNORMAL HIGH (ref 65–99)
Potassium: 3.7 mmol/L (ref 3.5–5.2)
Sodium: 135 mmol/L (ref 134–144)
eGFR: 91 mL/min/{1.73_m2} (ref >59)

## 2021-02-21 ENCOUNTER — Other Ambulatory Visit: Payer: Self-pay

## 2021-02-21 ENCOUNTER — Ambulatory Visit (HOSPITAL_BASED_OUTPATIENT_CLINIC_OR_DEPARTMENT_OTHER)
Admission: RE | Admit: 2021-02-21 | Discharge: 2021-02-21 | Disposition: A | Discharge status: Home / Self Care | Payer: 59 | Source: Ambulatory Visit | Attending: Cardiology | Admitting: Cardiology

## 2021-02-21 DIAGNOSIS — R0609 Other forms of dyspnea: Secondary | ICD-10-CM

## 2021-02-21 DIAGNOSIS — R0600 Dyspnea, unspecified: Secondary | ICD-10-CM | POA: Diagnosis not present

## 2021-02-21 LAB — ECHOCARDIOGRAM COMPLETE
AR max vel: 2.22 cm2
AV Area VTI: 2.41 cm2
AV Area mean vel: 2.14 cm2
AV Mean grad: 12 mmHg
AV Peak grad: 22 mmHg
Ao pk vel: 2.35 m/s
Area-P 1/2: 3.45 cm2
MV M vel: 5.2 m/s
MV Peak grad: 108 mmHg
P 1/2 time: 549 msec
S' Lateral: 2.72 cm

## 2021-02-22 ENCOUNTER — Telehealth (HOSPITAL_COMMUNITY): Payer: Self-pay | Admitting: *Deleted

## 2021-02-22 NOTE — Addendum Note (Signed)
Addended by: Eleonore Chiquito on: 02/22/2021 08:22 AM   Modules accepted: Orders

## 2021-02-22 NOTE — Telephone Encounter (Signed)
Interpreter [phone] (Spanish)  Patient given detailed instructions per Myocardial Perfusion Study Information Sheet for the test on 03/02/21 at 7:45. Patient notified to arrive 15 minutes early and that it is imperative to arrive on time for appointment to keep from having the test rescheduled.  If you need to cancel or reschedule your appointment, please call the office within 24 hours of your appointment. . Patient verbalized understanding.Daneil Dolin

## 2021-03-02 ENCOUNTER — Other Ambulatory Visit: Payer: Self-pay

## 2021-03-02 ENCOUNTER — Encounter (HOSPITAL_COMMUNITY): Payer: 59

## 2021-03-02 ENCOUNTER — Ambulatory Visit (HOSPITAL_COMMUNITY)
Admission: RE | Admit: 2021-03-02 | Discharge: 2021-03-02 | Disposition: A | Discharge status: Home / Self Care | Payer: 59 | Source: Ambulatory Visit | Attending: Cardiology | Admitting: Cardiology

## 2021-03-02 DIAGNOSIS — I714 Abdominal aortic aneurysm, without rupture, unspecified: Secondary | ICD-10-CM

## 2021-03-13 ENCOUNTER — Ambulatory Visit (HOSPITAL_BASED_OUTPATIENT_CLINIC_OR_DEPARTMENT_OTHER)
Admission: RE | Admit: 2021-03-13 | Discharge: 2021-03-13 | Disposition: A | Discharge status: Home / Self Care | Payer: 59 | Source: Ambulatory Visit | Attending: Cardiology | Admitting: Cardiology

## 2021-03-13 ENCOUNTER — Other Ambulatory Visit: Payer: Self-pay

## 2021-03-13 DIAGNOSIS — I714 Abdominal aortic aneurysm, without rupture, unspecified: Secondary | ICD-10-CM

## 2021-03-14 ENCOUNTER — Telehealth (HOSPITAL_COMMUNITY): Payer: Self-pay | Admitting: *Deleted

## 2021-03-14 ENCOUNTER — Telehealth: Payer: Self-pay | Admitting: Cardiology

## 2021-03-14 NOTE — Telephone Encounter (Signed)
Dr. Tomie China has already reviewed.

## 2021-03-14 NOTE — Telephone Encounter (Signed)
Tin from [facility] radiology calling to report results for pt.

## 2021-03-14 NOTE — Telephone Encounter (Signed)
Patient given detailed instructions per Myocardial Perfusion Study Information Sheet for the test on 03/21/21 at 1015. Patient notified to arrive 15 minutes early and that it is imperative to arrive on time for appointment to keep from having the test rescheduled.  If you need to cancel or reschedule your appointment, please call the office within 24 hours of your appointment. . Patient verbalized understanding.Bart Ashford, Adelene Idler No mychart available.

## 2021-03-14 NOTE — Telephone Encounter (Signed)
Patient given instructions by Interpreter line and [place] ID # [id number].Becky Berberian, Adelene Idler

## 2021-03-15 ENCOUNTER — Telehealth: Payer: Self-pay | Admitting: Cardiology

## 2021-03-15 MED ORDER — ELIQUIS 5 MG PO TABS: 5.0000 mg | ORAL_TABLET | Freq: Two times a day (BID) | ORAL | 3 refills | Status: DC

## 2021-03-15 NOTE — Telephone Encounter (Signed)
*  STAT* If patient is at the pharmacy, call can be transferred to refill team.   1. Which medications need to be refilled? (please list name of each medication and dose if known) Eliquis   2. Which pharmacy/location (including street and city if local pharmacy) is medication to be sent to? [facility]  3. Do they need a 30 day or 90 day supply? 90

## 2021-03-15 NOTE — Telephone Encounter (Signed)
Refill sent in per request.  

## 2021-03-21 ENCOUNTER — Other Ambulatory Visit: Payer: Self-pay

## 2021-03-21 ENCOUNTER — Ambulatory Visit (HOSPITAL_COMMUNITY): Payer: 59 | Attending: Cardiology

## 2021-03-21 DIAGNOSIS — R0609 Other forms of dyspnea: Secondary | ICD-10-CM

## 2021-03-21 DIAGNOSIS — R0600 Dyspnea, unspecified: Secondary | ICD-10-CM

## 2021-03-21 LAB — MYOCARDIAL PERFUSION IMAGING
LV dias vol: 132 mL (ref 62–150)
LV sys vol: 65 mL
Peak HR: 82 {beats}/min
Rest HR: 63 {beats}/min
SDS: 1
SRS: 0
SSS: 1
TID: 1.04

## 2021-03-21 MED ORDER — TECHNETIUM TC 99M TETROFOSMIN IV KIT
10.7000 | PACK | Freq: Once | INTRAVENOUS | Status: AC | PRN
Start: 2021-03-21 — End: 2021-03-21
  Administered 2021-03-21: 10.7 via INTRAVENOUS
  Filled 2021-03-21: qty 11

## 2021-03-21 MED ORDER — REGADENOSON 0.4 MG/5ML IV SOLN
0.4000 mg | Freq: Once | INTRAVENOUS | Status: AC
  Administered 2021-03-21: 0.4 mg via INTRAVENOUS

## 2021-03-21 MED ORDER — TECHNETIUM TC 99M TETROFOSMIN IV KIT
31.4000 | PACK | Freq: Once | INTRAVENOUS | Status: AC | PRN
Start: 2021-03-21 — End: 2021-03-21
  Administered 2021-03-21: 31.4 via INTRAVENOUS
  Filled 2021-03-21: qty 32

## 2021-05-01 ENCOUNTER — Ambulatory Visit (INDEPENDENT_AMBULATORY_CARE_PROVIDER_SITE_OTHER): Payer: 59 | Admitting: Cardiology

## 2021-05-01 ENCOUNTER — Encounter: Payer: Self-pay | Admitting: Cardiology

## 2021-05-01 ENCOUNTER — Other Ambulatory Visit: Payer: Self-pay

## 2021-05-01 VITALS — BP 132/68 | HR 72 | Ht 65.0 in | Wt 227.0 lb

## 2021-05-01 DIAGNOSIS — I2584 Coronary atherosclerosis due to calcified coronary lesion: Secondary | ICD-10-CM

## 2021-05-01 DIAGNOSIS — I7121 Aneurysm of the ascending aorta, without rupture: Secondary | ICD-10-CM

## 2021-05-01 DIAGNOSIS — I4821 Permanent atrial fibrillation: Secondary | ICD-10-CM

## 2021-05-01 DIAGNOSIS — I4811 Longstanding persistent atrial fibrillation: Secondary | ICD-10-CM

## 2021-05-01 DIAGNOSIS — I2510 Atherosclerotic heart disease of native coronary artery without angina pectoris: Secondary | ICD-10-CM

## 2021-05-01 DIAGNOSIS — E669 Obesity, unspecified: Secondary | ICD-10-CM | POA: Diagnosis not present

## 2021-05-01 DIAGNOSIS — I10 Essential (primary) hypertension: Secondary | ICD-10-CM

## 2021-05-01 DIAGNOSIS — I712 Thoracic aortic aneurysm, without rupture: Secondary | ICD-10-CM | POA: Insufficient documentation

## 2021-05-01 DIAGNOSIS — E7800 Pure hypercholesterolemia, unspecified: Secondary | ICD-10-CM

## 2021-05-01 DIAGNOSIS — E1165 Type 2 diabetes mellitus with hyperglycemia: Secondary | ICD-10-CM

## 2021-05-01 HISTORY — DX: Aneurysm of the ascending aorta, without rupture: I71.21

## 2021-05-01 HISTORY — DX: Atherosclerotic heart disease of native coronary artery without angina pectoris: I25.10

## 2021-05-01 MED ORDER — ATORVASTATIN CALCIUM 40 MG PO TABS: 40.0000 mg | ORAL_TABLET | Freq: Every day | ORAL | 3 refills | Status: DC

## 2021-05-01 MED ORDER — AMLODIPINE BESYLATE 5 MG PO TABS: 5.0000 mg | ORAL_TABLET | Freq: Every day | ORAL | 3 refills | Status: DC

## 2021-05-01 MED ORDER — LOSARTAN POTASSIUM 100 MG PO TABS: 100.0000 mg | ORAL_TABLET | Freq: Every day | ORAL | 3 refills | Status: DC

## 2021-05-01 MED ORDER — CARVEDILOL 12.5 MG PO TABS: 12.5000 mg | ORAL_TABLET | Freq: Two times a day (BID) | ORAL | 3 refills | Status: DC

## 2021-05-01 NOTE — Progress Notes (Signed)
Cardiology Office Note:    Date:  05/01/2021   ID:  Derek Schroeder, DOB [birth year], MRN [id number]  PCP:  Derek Guard, FNP  Cardiologist:  Derek Brothers, MD   Referring MD: Derek Guard, FNP    ASSESSMENT:    1. Primary hypertension   2. Longstanding persistent atrial fibrillation (HCC)   3. Permanent atrial fibrillation (HCC)   4. Obesity (BMI 35.0-39.9 without comorbidity)   5. Type 2 diabetes mellitus with hyperglycemia, without long-term current use of insulin (HCC)    PLAN:    In order of problems listed above:  Coronary artery calcification: Secondary prevention stressed with the patient.  Importance of compliance with diet medication stressed any vocalized understanding.  He was advised to walk on a regular basis and he promises to do so. 5 days a week. Essential hypertension: Blood pressure stable diet was emphasized.  Lifestyle modification urged. Mixed dyslipidemia: Lipids were reviewed treatment back in the next few days for fasting blood work including lipids. Ascending aortic aneurysm: I discussed this with him and we will monitor this closely. Extracardiac findings on CT such as not.:  I discussed this with him and told him that this will have to be followed by primary care provider and he understands and will discuss with him about this issue.  A copy of this report will be sent to primary care. Obesity: Diet was emphasized weight reduction stressed and he promises to do so.  Risks of obesity emphasized. Patient will be seen in follow-up appointment in 6 months or earlier if the patient has any concerns    Medication Adjustments/Labs and Tests Ordered: Current medicines are reviewed at length with the patient today.  Concerns regarding medicines are outlined above.  No orders of the defined types were placed in this encounter.  No orders of the defined types were placed in this encounter.    No chief complaint on file.    History of Present  Illness:    Derek Schroeder is a 75 y.o. male.  Patient has past medical history of essential hypertension, permanent atrial fibrillation, pulmonary embolism and obesity.  He has ascending aortic aneurysm diagnosed by CT scanning.  He denies any problems at this time and takes care of activities of daily living.  No chest pain orthopnea or PND.  He is seen today in the presence of an interpreter.  At the time of my evaluation, the patient is alert awake oriented and in no distress.  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   Obesity (BMI 35.0-39.9 without comorbidity) 06/09/2020   Permanent atrial fibrillation (HCC) 06/09/2020   Primary hypertension 08/19/2020   Pulmonary embolism on right (HCC) 01/05/2019   Pulmonary nodules 01/20/2019   Pure hypercholesterolemia 01/20/2019   Systolic heart failure (HCC) 01/07/2019   Type 2 diabetes mellitus with hyperglycemia, without long-term current use of insulin (HCC) 01/04/2019   Unavailability and inaccessibility of other helping agencies 04/21/2019   Uninsured 01/21/2019  Past Surgical History:  Procedure Laterality Date   NO PAST SURGERIES      Current Medications: Current Meds  Medication Sig   amLODipine (NORVASC) 5 MG tablet Take 5 mg by mouth daily.   atorvastatin (LIPITOR) 40 MG tablet Take 40 mg by mouth 2 (two) times daily.   carvedilol (COREG) 12.5 MG tablet Take 12.5 mg by  mouth 2 (two) times daily.   ELIQUIS 5 MG TABS tablet Take 1 tablet (5 mg total) by mouth 2 (two) times daily.   furosemide (LASIX) 80 MG tablet Take 80 mg by mouth 2 (two) times daily.   losartan (COZAAR) 50 MG tablet Take 100 mg by mouth daily.   metFORMIN (GLUCOPHAGE) 1000 MG tablet Take 1,000 mg by mouth 2 (two) times daily with a meal.   Potassium Chloride ER 20 MEQ TBCR Take 1 tablet by mouth 2 (two) times daily.     Allergies:   Patient has no known allergies.   Social History   Socioeconomic History   Marital status: Married    Spouse name: Not on file   Number of children: Not on file   Years of education: Not on file   Highest education level: Not on file  Occupational History   Not on file  Tobacco Use   Smoking status: Former    Types: Cigarettes    Quit date: 06/09/2016    Years since quitting: 4.8   Smokeless tobacco: Never  Substance and Sexual Activity   Alcohol use: Never   Drug use: Never   Sexual activity: Not on file  Other Topics Concern   Not on file  Social History Narrative   Not on file   Social Determinants of Health   Financial Resource Strain: Not on file  Food Insecurity: Not on file  Transportation Needs: Not on file  Physical Activity: Not on file  Stress: Not on file  Social Connections: Not on file     Family History: The patient's family history includes Diabetes in his father.  ROS:   Please see the history of present illness.    All other systems reviewed and are negative.  EKGs/Labs/Other Studies Reviewed:    The following studies were reviewed today: IMPRESSION: 1. Dilatation of the ascending thoracic aorta to 4.5 cm. Recommend semi-annual imaging followup by CTA or MRA and referral to cardiothoracic surgery if not already obtained. This recommendation follows 2010 ACCF/AHA/AATS/ACR/ASA/SCA/SCAI/SIR/STS/SVM Guidelines for the Diagnosis and Management of Patients With Thoracic Aortic Disease. Circulation. 2010; 121:  J696-V893. Aortic aneurysm NOS (ICD10-I71.9). 2. RIGHT middle lobe noncalcified pulmonary nodule. Non-contrast chest CT at 6-12 months is recommended. If the nodule is stable at time of repeat CT, then future CT at 18-24 months (from today's scan) is considered optional for low-risk patients, but is recommended for high-risk patients. This recommendation follows the consensus statement: Guidelines for Management of Incidental Pulmonary Nodules Detected on CT Images: From the Fleischner Society 2017; Radiology 2017; 284:228-243.   These results will be called to the ordering clinician or representative by the Radiologist Assistant, and communication documented in the PACS or [employer].     Electronically Signed   By: Genevive Bi M.D.   On: 03/13/2021 16:42   Recent Labs: 02/08/2021: BUN 21; Creatinine, Ser 0.88; Potassium 3.7; Sodium 135  Recent Lipid Panel No results found for: CHOL, TRIG, HDL, CHOLHDL, VLDL, LDLCALC, LDLDIRECT  Physical Exam:    VS:  BP 132/68   Pulse 72   Ht 5\' 5"  (1.651  m)   Wt 227 lb (103 kg)   BMI 37.77 kg/m     Wt Readings from Last 3 Encounters:  05/01/21 227 lb (103 kg)  03/21/21 221 lb (100.2 kg)  02/08/21 221 lb 1.9 oz (100.3 kg)     GEN: Patient is in no acute distress HEENT: Normal NECK: No JVD; No carotid bruits LYMPHATICS: No lymphadenopathy CARDIAC: Hear sounds regular, 2/6 systolic murmur at the apex. RESPIRATORY:  Clear to auscultation without rales, wheezing or rhonchi  ABDOMEN: Soft, non-tender, non-distended MUSCULOSKELETAL:  No edema; No deformity  SKIN: Warm and dry NEUROLOGIC:  Alert and oriented x 3 PSYCHIATRIC:  Normal affect   Signed, Derek Brothers, MD  05/01/2021 10:24 AM     [facility]

## 2021-05-01 NOTE — Patient Instructions (Addendum)
Medication Instructions:  No medication changes. *If you need a refill on your cardiac medications before your next appointment, please call your pharmacy*   Lab Work: Your physician recommends that you return for lab work in: the next few days. You need to have labs done when you are fasting.  You can come Monday through Friday 8:30 am to 12:00 pm and 1:15 to 4:30. You do not need to make an appointment as the order has already been placed. The labs you are going to have done are BMET, CBC, TSH, LFT and Lipids.   Su mdico recomienda que regrese para el anlisis de laboratorio en: los [employer]. Debe realizarse anlisis de laboratorio cuando est en ayunas. Puede venir de lunes a viernes de 8:30 a. m. a 12:00 p. m. y de 1:15 a. m. a 4:30 p. m. No es necesario concertar cita ya que el pedido ya se ha realizado. Los laboratorios que le Zenaida Niece a hacer son BMET, CBC, TSH, LFT y Lipids.  If you have labs (blood work) drawn today and your tests are completely normal, you will receive your results only by: MyChart Message (if you have MyChart) OR A paper copy in the mail If you have any lab test that is abnormal or we need to change your treatment, we will call you to review the results.   Testing/Procedures: None ordered   Follow-Up: At [facility], you and your health needs are our priority.  As part of our continuing mission to provide you with exceptional heart care, we have created designated Provider Care Teams.  These Care Teams include your primary Cardiologist (physician) and Advanced Practice Providers (APPs -  Physician Assistants and Nurse Practitioners) who all work together to provide you with the care you need, when you need it.  We recommend signing up for the patient portal called "MyChart".  Sign up information is provided on this After Visit Summary.  MyChart is used to connect with patients for Virtual Visits (Telemedicine).  Patients are able to view lab/test results,  encounter notes, upcoming appointments, etc.  Non-urgent messages can be sent to your provider as well.   To learn more about what you can do with MyChart, go to [email or web address].    Your next appointment:   6 month(s)  The format for your next appointment:   In Person  Provider:   Belva Crome, MD   Other Instructions NA

## 2021-05-09 LAB — CBC WITH DIFFERENTIAL/PLATELET
Basophils Absolute: 0.1 10*3/uL (ref 0.0–0.2)
Basos: 1 %
EOS (ABSOLUTE): 0.4 10*3/uL (ref 0.0–0.4)
Eos: 6 %
Hematocrit: 39.8 % (ref 37.5–51.0)
Hemoglobin: 13 g/dL (ref 13.0–17.7)
Immature Grans (Abs): 0 10*3/uL (ref 0.0–0.1)
Immature Granulocytes: 0 %
Lymphocytes Absolute: 1.3 10*3/uL (ref 0.7–3.1)
Lymphs: 20 %
MCH: 29.5 pg (ref 26.6–33.0)
MCHC: 32.7 g/dL (ref 31.5–35.7)
MCV: 90 fL (ref 79–97)
Monocytes Absolute: 0.5 10*3/uL (ref 0.1–0.9)
Monocytes: 8 %
Neutrophils Absolute: 4.3 10*3/uL (ref 1.4–7.0)
Neutrophils: 65 %
Platelets: 256 10*3/uL (ref 150–450)
RBC: 4.41 x10E6/uL (ref 4.14–5.80)
RDW: 13.1 % (ref 11.6–15.4)
WBC: 6.6 10*3/uL (ref 3.4–10.8)

## 2021-05-09 LAB — BASIC METABOLIC PANEL
BUN/Creatinine Ratio: 19 (ref 10–24)
BUN: 19 mg/dL (ref 8–27)
CO2: 24 mmol/L (ref 20–29)
Calcium: 9.6 mg/dL (ref 8.6–10.2)
Chloride: 100 mmol/L (ref 96–106)
Creatinine, Ser: 1.01 mg/dL (ref 0.76–1.27)
Glucose: 160 mg/dL — ABNORMAL HIGH (ref 65–99)
Potassium: 4.6 mmol/L (ref 3.5–5.2)
Sodium: 141 mmol/L (ref 134–144)
eGFR: 79 mL/min/{1.73_m2} (ref >59)

## 2021-05-09 LAB — HEPATIC FUNCTION PANEL
ALT: 15 IU/L (ref 0–44)
AST: 15 IU/L (ref 0–40)
Albumin: 4.4 g/dL (ref 3.7–4.7)
Alkaline Phosphatase: 84 IU/L (ref 44–121)
Bilirubin Total: 0.5 mg/dL (ref 0.0–1.2)
Bilirubin, Direct: 0.19 mg/dL (ref 0.00–0.40)
Total Protein: 7.3 g/dL (ref 6.0–8.5)

## 2021-05-09 LAB — LIPID PANEL
Chol/HDL Ratio: 3 ratio (ref 0.0–5.0)
Cholesterol, Total: 103 mg/dL (ref 100–199)
HDL: 34 mg/dL — ABNORMAL LOW (ref >39)
LDL Chol Calc (NIH): 52 mg/dL (ref 0–99)
Triglycerides: 84 mg/dL (ref 0–149)
VLDL Cholesterol Cal: 17 mg/dL (ref 5–40)

## 2021-05-09 LAB — TSH: TSH: 1.51 u[IU]/mL (ref 0.450–4.500)

## 2021-07-06 ENCOUNTER — Encounter: Payer: Self-pay | Admitting: Podiatry

## 2021-07-06 ENCOUNTER — Ambulatory Visit (INDEPENDENT_AMBULATORY_CARE_PROVIDER_SITE_OTHER): Payer: 59 | Admitting: Podiatry

## 2021-07-06 ENCOUNTER — Other Ambulatory Visit: Payer: Self-pay

## 2021-07-06 DIAGNOSIS — Q828 Other specified congenital malformations of skin: Secondary | ICD-10-CM | POA: Diagnosis not present

## 2021-07-06 DIAGNOSIS — E1165 Type 2 diabetes mellitus with hyperglycemia: Secondary | ICD-10-CM | POA: Diagnosis not present

## 2021-07-06 NOTE — Progress Notes (Signed)
Subjective: 75 year old male presents the office with interpreter for follow-up of calluses and diabetic foot check. States he has been doing well and not had much pain in his feet. He relates he does feel some buildup on the outside of his left foot. Otherwise well.  Last A1c was 8.3. Denies any systemic complaints such as fevers, chills, nausea, vomiting. No acute changes since last appointment, and no other complaints at this time.   PCP Dorian Furnace FNP   He is currently taking Eliquis  Objective: AAO x3, NAD DP/PT pulses palpable bilaterally, CRT less than 3 seconds Hyperkeratotic lesions noted on both the left and right foot.  There are multiple small annular hyperkeratotic lesions there are 2 lesions on each foot.  No pain with calf compression, swelling, warmth, erythema  Assessment: Hyperkeratotic lesions, currently on Eliquis  Plan: -All treatment options discussed with the patient including all alternatives, risks, complications.  -Sharply debrided the hyperkeratotic lesions x 4 without incident Continue offloading as well as moisturizer.  Apply the moisturizer once the area of the left side is completely healed. -Return in 6 months for diabetic foot check.  -Patient encouraged to call the office with any questions, concerns, change in symptoms.   Louann Sjogren DPM

## 2021-09-15 IMAGING — CT CT CHEST W/O CM
2 of 3 series · 15 of 36 positions shown, 18 images · non-contrast
Comparison: None.

CLINICAL DATA: Aortic disease, nontraumatic.

EXAM:
CT CHEST WITHOUT CONTRAST
TECHNIQUE: Multidetector CT imaging of the chest was performed following the
standard protocol without IV contrast.

[Series 2: thorax · axial · 0.84mm/px · z∈[-291,-35]mm · 12 of 152 slices shown, 15 images]
[im 12/152  mediastinal]
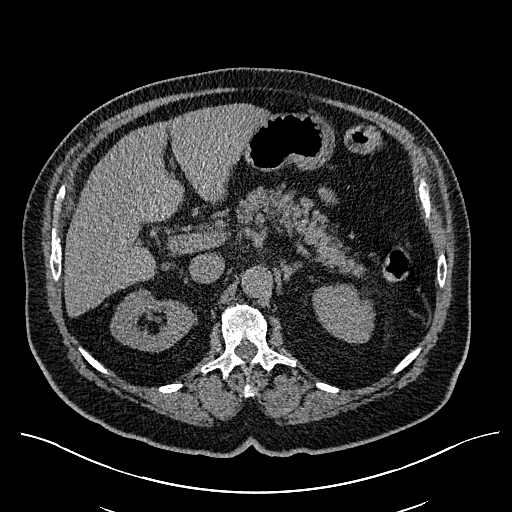
[im 12/152  lung]
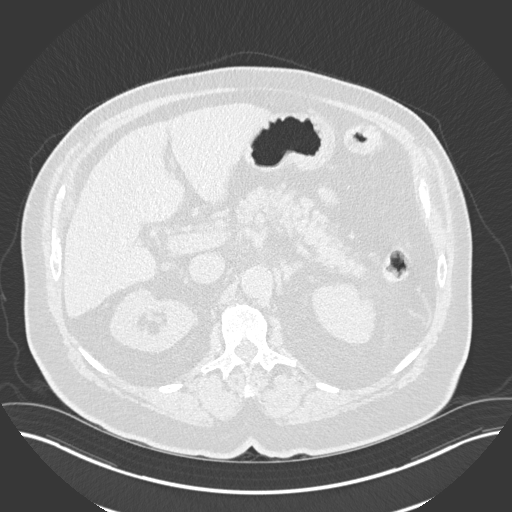
[im 23/152  lung]
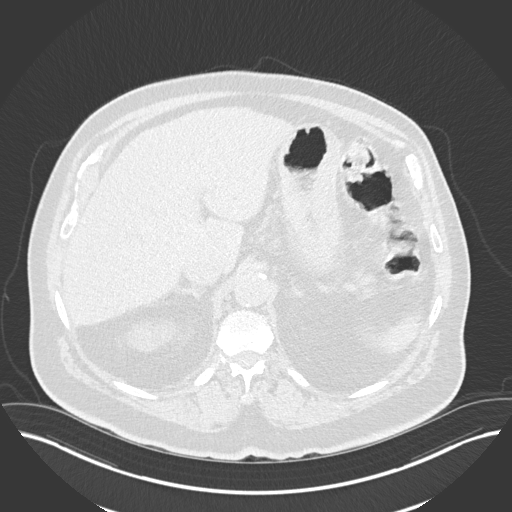
[im 34/152  lung]
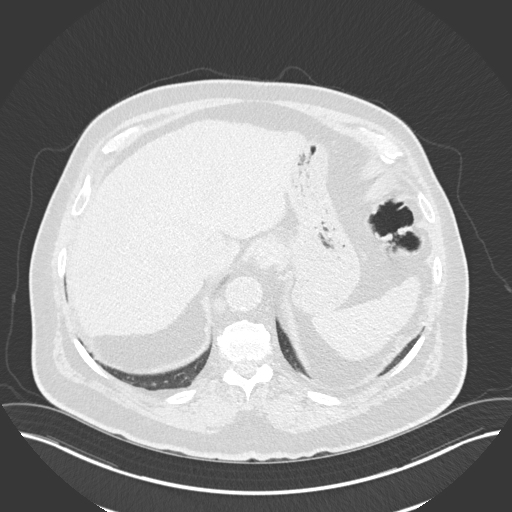
[im 45/152  lung]
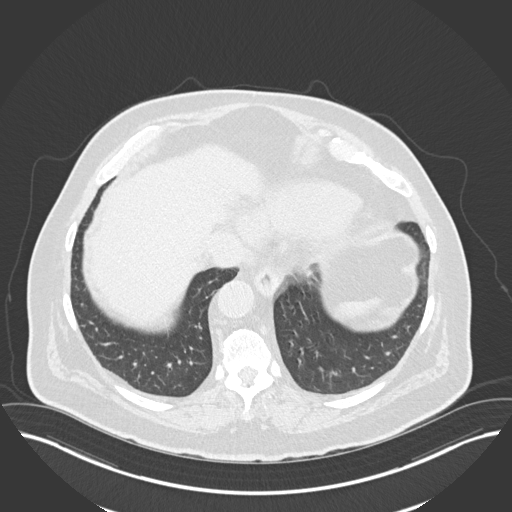
[im 56/152  mediastinal]
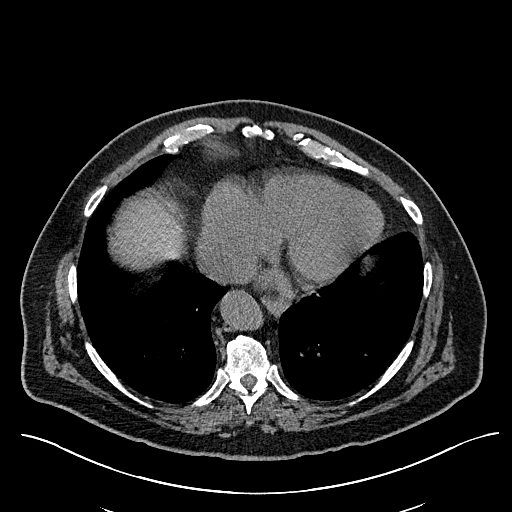
[im 56/152  lung]
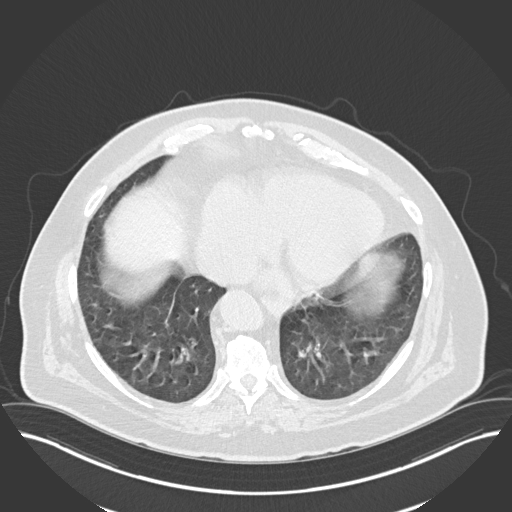
[im 68/152  lung]
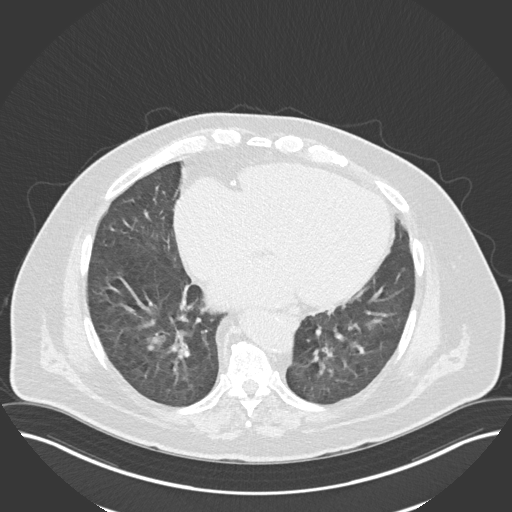
[im 84/152  lung]
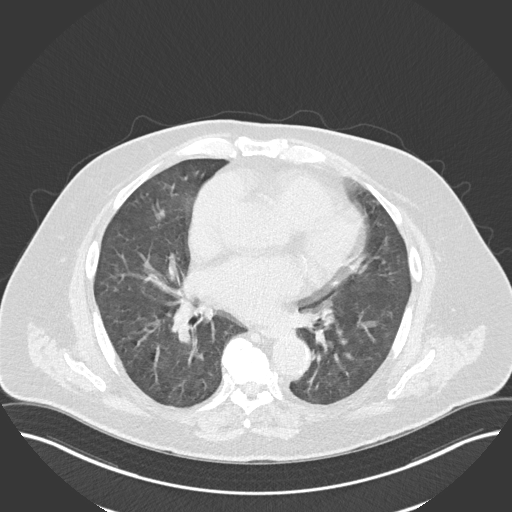
[im 96/152  lung]
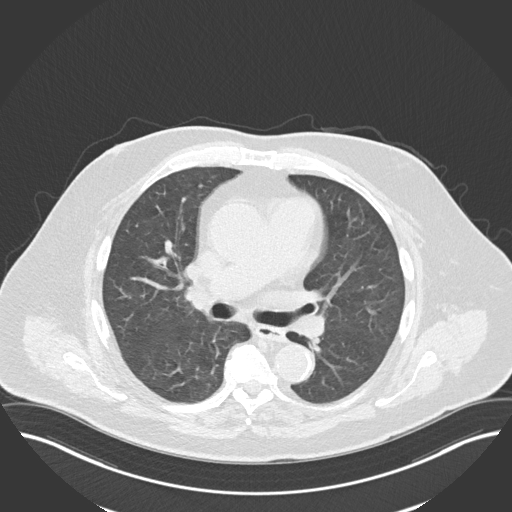
[im 107/152  mediastinal]
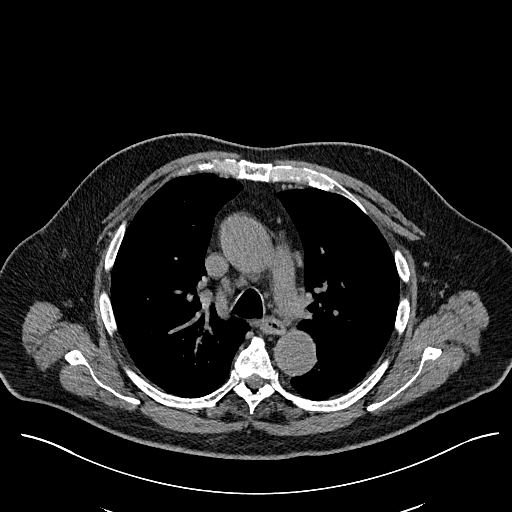
[im 107/152  lung]
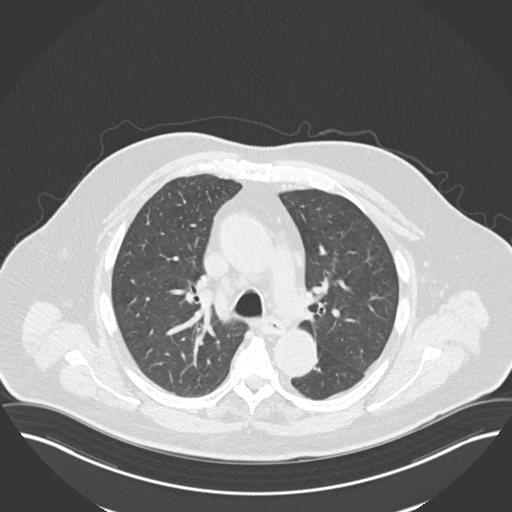
[im 118/152  lung]
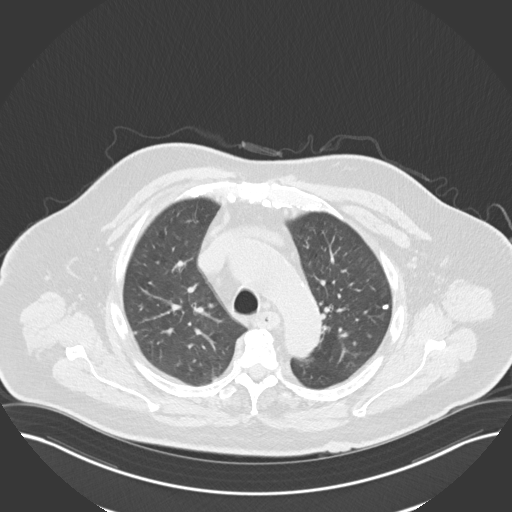
[im 129/152  lung]
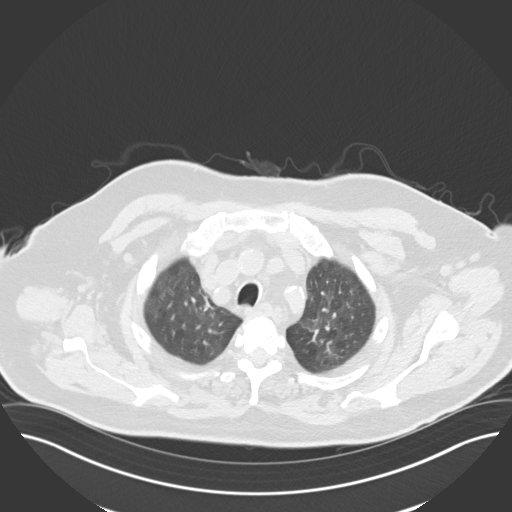
[im 140/152  lung]
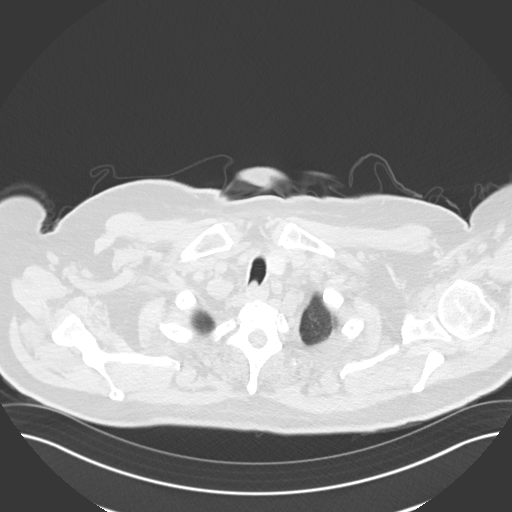

[Series 5: coronal · coronal · 0.59mm/px · 3 of 172 slices shown]
[im 35/172  lung]
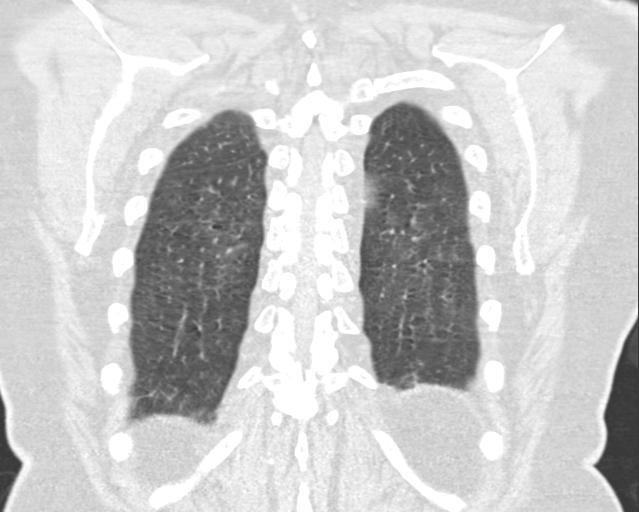
[im 69/172  lung]
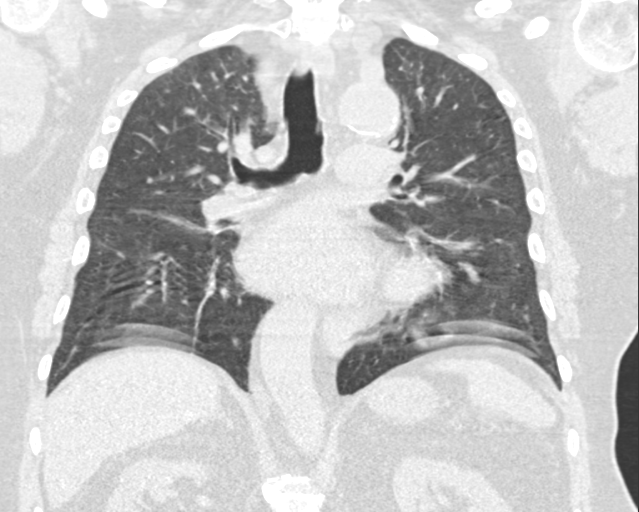
[im 103/172  lung]
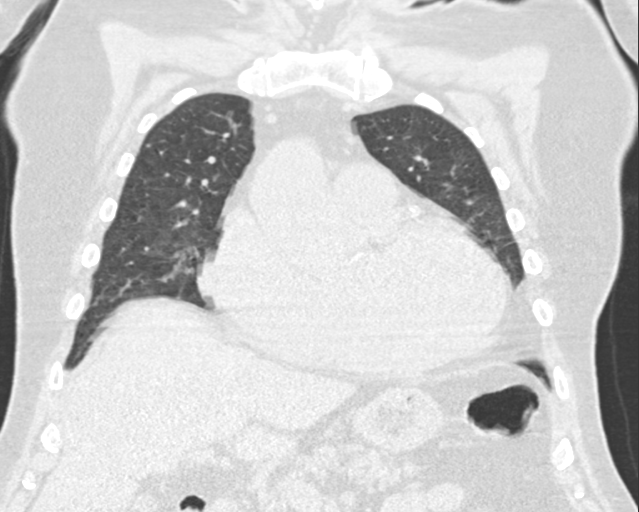

[15 of 36 positions shown; findings below may reference images not displayed]

FINDINGS: Cardiovascular: Ascending thoracic aorta measures 45 mm in diameter
open (sagittal image 90/series 6). Measurement at the level the
pulmonary outflow track. Calcification of the transverse arch.
Descending thoracic aorta normal caliber at 27 mm.

Coronary artery calcification and aortic atherosclerotic
calcification.

Mediastinum/Nodes: No axillary supraclavicular adenopathy no
mediastinal or hilar adenopathy. Esophagus and trachea normal.
Bilateral small calcified pulmonary nodules.

Lungs/Pleura: Several calcified pulmonary nodules. One noncalcified
nodule in the RIGHT middle lobe measures 7 mm (image 75/3).

Upper Abdomen: Limited view of the liver, kidneys, pancreas are
unremarkable. Normal adrenal glands. No aggressive osseous lesion.

Musculoskeletal: No aggressive osseous lesion.
IMPRESSION: 1. Dilatation of the ascending thoracic aorta to 4.5 cm. Recommend
semi-annual imaging followup by CTA or MRA and referral to
cardiothoracic surgery if not already obtained. This recommendation
follows 7525 ACCF/AHA/AATS/ACR/ASA/SCA/SADULAH/FANELLI/KUSAKABE/AMNON Guidelines
for the Diagnosis and Management of Patients With Thoracic Aortic
Disease. Circulation. 7525; 121: E266-e369. Aortic aneurysm NOS
([id number]).
2. RIGHT middle lobe noncalcified pulmonary nodule. Non-contrast
chest CT at 6-12 months is recommended. If the nodule is stable at
time of repeat CT, then future CT at 18-24 months (from today's
scan) is considered optional for low-risk patients, but is
recommended for high-risk patients. This recommendation follows the
consensus statement: Guidelines for Management of Incidental
Pulmonary Nodules Detected on CT Images: From the [HOSPITAL]

These results will be called to the ordering clinician or
representative by the Radiologist Assistant, and communication
documented in the PACS or [REDACTED].

## 2021-11-07 ENCOUNTER — Ambulatory Visit: Payer: 59 | Admitting: Cardiology

## 2021-12-18 ENCOUNTER — Ambulatory Visit: Payer: Managed Care, Other (non HMO) | Admitting: Cardiology

## 2021-12-18 ENCOUNTER — Telehealth: Payer: Self-pay

## 2021-12-18 ENCOUNTER — Encounter: Payer: Self-pay | Admitting: Cardiology

## 2021-12-18 VITALS — BP 126/68 | HR 70 | Ht 67.72 in | Wt 219.1 lb

## 2021-12-18 DIAGNOSIS — I4821 Permanent atrial fibrillation: Secondary | ICD-10-CM

## 2021-12-18 DIAGNOSIS — I10 Essential (primary) hypertension: Secondary | ICD-10-CM

## 2021-12-18 DIAGNOSIS — I2584 Coronary atherosclerosis due to calcified coronary lesion: Secondary | ICD-10-CM

## 2021-12-18 DIAGNOSIS — M79605 Pain in left leg: Secondary | ICD-10-CM

## 2021-12-18 DIAGNOSIS — I2510 Atherosclerotic heart disease of native coronary artery without angina pectoris: Secondary | ICD-10-CM | POA: Diagnosis not present

## 2021-12-18 DIAGNOSIS — I7121 Aneurysm of the ascending aorta, without rupture: Secondary | ICD-10-CM | POA: Diagnosis not present

## 2021-12-18 DIAGNOSIS — E66811 Obesity, class 1: Secondary | ICD-10-CM | POA: Insufficient documentation

## 2021-12-18 DIAGNOSIS — E669 Obesity, unspecified: Secondary | ICD-10-CM

## 2021-12-18 DIAGNOSIS — M79604 Pain in right leg: Secondary | ICD-10-CM

## 2021-12-18 DIAGNOSIS — E1165 Type 2 diabetes mellitus with hyperglycemia: Secondary | ICD-10-CM | POA: Diagnosis not present

## 2021-12-18 HISTORY — DX: Obesity, unspecified: E66.9

## 2021-12-18 NOTE — Patient Instructions (Addendum)
Instrucciones de medicamentos: ?Su m?dico recomienda que contin?e con sus medicamentos actuales seg?n las indicaciones. Consulte la lista de medicamentos actuales que se le entreg? hoy. ?*Si necesita un resurtido de sus medicamentos card?acos antes de su pr?xima cita, llame a su farmacia* ? ? ?[employer] de laboratorio: ?Ninguno ordenado ?Si se le realizaron an?lisis de laboratorio (an?lisis de [place]) hoy y sus pruebas son completamente normales, recibir? sus resultados solo cuando: ? Mensaje de MyChart (si tiene Pharmacist, community) O ? Una copia en papel por correo ?Si tiene [place] prueba de laboratorio que es anormal o necesitamos cambiar su tratamiento, lo llamaremos para [employer]. ? ? ?Pruebas/Procedimientos: ?DVT study ? ? ?Hacer un seguimiento: ?En [facility], usted y sus necesidades de salud son Cleotis Nipper prioridad. Como parte de [place] misi?n continua de brindarle una atenci?n card?aca excepcional, hemos creado equipos de atenci?n de proveedores designados. Estos equipos de atenci?n incluyen a su cardi?logo primario (m?dico) y proveedores de pr?ctica avanzada (APP, asistentes m?dicos y enfermeras practicantes) que trabajan juntos para brindarle la atenci?n que necesita, cuando la necesita. ? ?Recomendamos registrarse en el portal del paciente llamado "MyChart". La informaci?n de registro se proporciona en este Resumen posterior a la visita. MyChart se [place] para conectarse con pacientes para visitas virtuales (telemedicina). Los [employer] de laboratorio/pruebas, notas de encuentros, pr?ximas citas, etc. Tambi?n se pueden enviar mensajes no urgentes a su proveedor. ?Para obtener m?s informaci?n sobre lo que [employer] con MyChart, vaya a [email or web address]. ? ?Su pr?xima cita: ?9 meses) ? ?El formato para su pr?xima cita: ?En persona ? ?Proveedor: ?Dr. Sunny Schlein Revankar ? ? ?Otras instrucciones ?N / A ? ? ? ? ? ?Medication Instructions:  ?Your physician recommends that you continue  on your current medications as directed. Please refer to the Current Medication list given to you today.  ?*If you need a refill on your cardiac medications before your next appointment, please call your pharmacy* ? ? ?Lab Work: ?None ordered ?If you have labs (blood work) drawn today and your tests are completely normal, you will receive your results only by: ?MyChart Message (if you have MyChart) OR ?A paper copy in the mail ?If you have any lab test that is abnormal or we need to change your treatment, we will call you to review the results. ? ? ?Testing/Procedures: ?DVT study ? ? ?Follow-Up: ?At [facility], you and your health needs are our priority.  As part of our continuing mission to provide you with exceptional heart care, we have created designated Provider Care Teams.  These Care Teams include your primary Cardiologist (physician) and Advanced Practice Providers (APPs -  Physician Assistants and Nurse Practitioners) who all work together to provide you with the care you need, when you need it. ? ?We recommend signing up for the patient portal called "MyChart".  Sign up information is provided on this After Visit Summary.  MyChart is used to connect with patients for Virtual Visits (Telemedicine).  Patients are able to view lab/test results, encounter notes, upcoming appointments, etc.  Non-urgent messages can be sent to your provider as well.   ?To learn more about what you can do with MyChart, go to [email or web address].   ? ?Your next appointment:   ?9 month(s) ? ?The format for your next appointment:   ?In Person ? ?Provider:   ?[name], MD ? ? ?Other Instructions ?NA  ?

## 2021-12-18 NOTE — Progress Notes (Signed)
?Cardiology Office Note:   ? ?Date:  12/18/2021  ? ?ID:  Derek Schroeder, DOB Apr 11, 1946, MRN [id number] ? ?PCP:  Nechama Guard, FNP  ?Cardiologist:  Garwin Brothers, MD  ? ?Referring MD: Nechama Guard, FNP  ? ? ?ASSESSMENT:   ? ?1. Primary hypertension   ?2. Aneurysm of ascending aorta without rupture (HCC)   ?3. Coronary artery calcification   ?4. Type 2 diabetes mellitus with hyperglycemia, without long-term current use of insulin (HCC)   ?5. Obesity (BMI 30.0-34.9)   ?6. Pain in both lower extremities   ?7. Permanent atrial fibrillation (HCC)   ? ?PLAN:   ? ?In order of problems listed above: ? ?Coronary artery calcification: Secondary prevention stressed.  Importance of compliance with diet medication stressed any vocalized understanding.  He was advised to walk at least half an hour a day 5 days a week and he promises to do so. ?Essential hypertension: Blood pressure stable.  I discussed this with him. ?Persistent atrial fibrillation:I discussed with the patient atrial fibrillation, disease process. Management and therapy including rate and rhythm control, anticoagulation benefits and potential risks were discussed extensively with the patient. Patient had multiple questions which were answered to patient's satisfaction. ?Ascending aortic aneurysm: Stable and will be monitored with CT scan with the next visit. ?Diabetes mellitus and mixed dyslipidemia: Managed by primary care.  He mentions to me that he had blood work done recently. ?Bilateral pedal edema left greater than right.  I will do a DVT study.  This is chronic and going on for a very long time I just want to rule out any issues with DVT.  He is on a blood thinner and I just want to do this testing.  I think he is compliant with his medications though I cannot be absolutely sure.  I expressed my concern about compliance and he told me that he takes his medications regularly. ?Patient will be seen in follow-up appointment in 6 months or  earlier if the patient has any concerns ? ? ? ?Medication Adjustments/Labs and Tests Ordered: ?Current medicines are reviewed at length with the patient today.  Concerns regarding medicines are outlined above.  ?Orders Placed This Encounter  ?Procedures  ? EKG 12-Lead  ? VAS [place] LOWER EXTREMITY VENOUS (DVT)  ? ?No orders of the defined types were placed in this encounter. ? ? ? ?No chief complaint on file. ?  ? ?History of Present Illness:   ? ?Derek Schroeder is a 76 y.o. male.  Patient has past medical history of atrial fibrillation, essential hypertension, dyslipidemia and ascending aortic aneurysm.  He denies any problems at this time and takes care of activities of daily living.  No chest pain orthopnea or PND.  At the time of my evaluation, the patient is alert awake oriented and in no distress. ? ?Past Medical History:  ?Diagnosis Date  ? Abdominal aortic aneurysm (AAA) without rupture (HCC) 01/20/2019  ? Acute bronchitis due to human metapneumovirus 08/19/2020  ? Acute hypoxemic respiratory failure (HCC) 10/27/2019  ? Ascending aortic aneurysm (HCC) 05/01/2021  ? Atrial fibrillation (HCC) 01/07/2019  ? Bicuspid aortic valve 02/09/2019  ? CHF exacerbation (HCC) 10/29/2019  ? Chronic diastolic heart failure (HCC) 08/19/2020  ? Coronary artery calcification 05/01/2021  ? DOE (dyspnea on exertion) 02/08/2021  ? HTN (hypertension) 01/04/2019  ? Hypertensive urgency 10/29/2019  ? Insomnia 01/21/2019  ? Longstanding persistent atrial fibrillation (HCC) 08/19/2020  ? 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 Obesity (BMI 35.0-39.9 without  comorbidity) 06/09/2020  ? Permanent atrial fibrillation (HCC) 06/09/2020  ? Primary hypertension 08/19/2020  ? Pulmonary embolism on right [facility]) 01/05/2019  ? Pulmonary nodules 01/20/2019  ? Pure hypercholesterolemia 01/20/2019  ? Systolic heart failure (HCC) 01/07/2019  ? Type 2 diabetes mellitus with hyperglycemia, without long-term current use of insulin (HCC) 01/04/2019  ? Unavailability  and inaccessibility of other helping agencies 04/21/2019  ? Uninsured 01/21/2019  ? ? ?Past Surgical History:  ?Procedure Laterality Date  ? NO PAST SURGERIES    ? ? ?Current Medications: ?Current Meds  ?Medication Sig  ? amLODipine (NORVASC) 5 MG tablet Take 1 tablet (5 mg total) by mouth daily.  ? atorvastatin (LIPITOR) 40 MG tablet Take 1 tablet (40 mg total) by mouth daily.  ? carvedilol (COREG) 12.5 MG tablet Take 1 tablet (12.5 mg total) by mouth 2 (two) times daily.  ? ELIQUIS 5 MG TABS tablet Take 1 tablet (5 mg total) by mouth 2 (two) times daily.  ? furosemide (LASIX) 80 MG tablet Take 80 mg by mouth 2 (two) times daily.  ? losartan (COZAAR) 50 MG tablet Take 100 mg by mouth daily.  ? metFORMIN (GLUCOPHAGE) 1000 MG tablet Take 1,000 mg by mouth 2 (two) times daily with a meal.  ? pantoprazole (PROTONIX) 40 MG tablet Take 40 mg by mouth daily.  ? Potassium Chloride ER 20 MEQ TBCR Take 1 tablet by mouth 2 (two) times daily.  ?  ? ?Allergies:   Patient has no known allergies.  ? ?Social History  ? ?Socioeconomic History  ? Marital status: Married  ?  Spouse name: Not on file  ? Number of children: Not on file  ? Years of education: Not on file  ? Highest education level: Not on file  ?Occupational History  ? Not on file  ?Tobacco Use  ? Smoking status: Former  ?  Types: Cigarettes  ?  Quit date: 06/09/2016  ?  Years since quitting: 5.5  ? Smokeless tobacco: Never  ?Substance and Sexual Activity  ? Alcohol use: Never  ? Drug use: Never  ? Sexual activity: Not on file  ?Other Topics Concern  ? Not on file  ?Social History Narrative  ? Not on file  ? ?Social Determinants of Health  ? ?Financial Resource Strain: Not on file  ?Food Insecurity: Not on file  ?Transportation Needs: Not on file  ?Physical Activity: Not on file  ?Stress: Not on file  ?Social Connections: Not on file  ?  ? ?Family History: ?The patient's family history includes Diabetes in his father. ? ?ROS:   ?Please see the history of present illness.     ?All other systems reviewed and are negative. ? ?EKGs/Labs/Other Studies Reviewed:   ? ?The following studies were reviewed today: ?EKG reveals atrial fibrillation with well-controlled ventricular rate. ? ? ?Recent Labs: ?05/08/2021: ALT 15; BUN 19; Creatinine, Ser 1.01; Hemoglobin 13.0; Platelets 256; Potassium 4.6; Sodium 141; TSH 1.510  ?Recent Lipid Panel ?   ?Component Value Date/Time  ? CHOL 103 05/08/2021 0920  ? TRIG 84 05/08/2021 0920  ? HDL 34 (L) 05/08/2021 0920  ? CHOLHDL 3.0 05/08/2021 0920  ? LDLCALC 52 05/08/2021 0920  ? ? ?Physical Exam:   ? ?VS:  BP 126/68   Pulse 70   Ht 5' 7.72" (1.72 m)   Wt 219 lb 1.3 oz (99.4 kg)   SpO2 96%   BMI 33.59 kg/m?    ? ?Wt Readings from Last 3 Encounters:  ?12/18/21 219 lb  1.3 oz (99.4 kg)  ?05/01/21 227 lb (103 kg)  ?03/21/21 221 lb (100.2 kg)  ?  ? ?GEN: Patient is in no acute distress ?HEENT: Normal ?NECK: No JVD; No carotid bruits ?LYMPHATICS: No lymphadenopathy ?CARDIAC: Hear sounds regular, 2/6 systolic murmur at the apex. ?RESPIRATORY:  Clear to auscultation without rales, wheezing or rhonchi  ?ABDOMEN: Soft, non-tender, non-distended ?MUSCULOSKELETAL:  No edema; No deformity  ?SKIN: Warm and dry ?NEUROLOGIC:  Alert and oriented x 3 ?PSYCHIATRIC:  Normal affect  ? ?Signed, ?Garwin Brothersajan R Loyola Santino, MD  ?12/18/2021 2:01 PM    ?[facility]  ?

## 2021-12-18 NOTE — Telephone Encounter (Signed)
PA started on CMM for Eliquis 5 mg. [place] [id number] ?

## 2021-12-18 NOTE — Telephone Encounter (Signed)
PA approved from 12/18/21 until 12/18/22 ?

## 2021-12-24 ENCOUNTER — Telehealth (HOSPITAL_BASED_OUTPATIENT_CLINIC_OR_DEPARTMENT_OTHER): Payer: Self-pay

## 2021-12-24 ENCOUNTER — Ambulatory Visit (HOSPITAL_BASED_OUTPATIENT_CLINIC_OR_DEPARTMENT_OTHER): Payer: Managed Care, Other (non HMO)

## 2022-01-04 ENCOUNTER — Encounter: Payer: Self-pay | Admitting: Podiatry

## 2022-01-04 ENCOUNTER — Ambulatory Visit (INDEPENDENT_AMBULATORY_CARE_PROVIDER_SITE_OTHER): Payer: Managed Care, Other (non HMO) | Admitting: Podiatry

## 2022-01-04 DIAGNOSIS — Q828 Other specified congenital malformations of skin: Secondary | ICD-10-CM

## 2022-01-04 DIAGNOSIS — E1165 Type 2 diabetes mellitus with hyperglycemia: Secondary | ICD-10-CM | POA: Diagnosis not present

## 2022-01-04 NOTE — Progress Notes (Signed)
Subjective: ?76 year old male presents the office with interpreter for follow-up of calluses and diabetic foot check. States he has been doing well and not had much pain in his feet.  Last A1c was 8.3. Denies any systemic complaints such as fevers, chills, nausea, vomiting. No acute changes since last appointment, and no other complaints at this time.  ? ?PCP Dorian Furnace FNP  ? ?He is currently taking Eliquis ? ?Objective: ?AAO x3, NAD ?DP/PT pulses palpable bilaterally, CRT less than 3 seconds ?Hyperkeratotic lesions noted on both the left and right foot.  There are multiple small annular hyperkeratotic lesions there are 2 lesions on each foot.  ?No pain with calf compression, swelling, warmth, erythema ? ?Assessment: ?Hyperkeratotic lesions, currently on Eliquis ? ?Plan: ?-All treatment options discussed with the patient including all alternatives, risks, complications.  ?-Sharply debrided the hyperkeratotic lesions x 4 without incident Continue offloading as well as moisturizer.  Apply the moisturizer once the area of the left side is completely healed. ?-Return in 6 months for diabetic foot check.  ?-Patient encouraged to call the office with any questions, concerns, change in symptoms.  ? ?Louann Sjogren DPM ? ?

## 2022-04-13 ENCOUNTER — Other Ambulatory Visit: Payer: Self-pay | Admitting: Cardiology

## 2022-04-13 DIAGNOSIS — I4821 Permanent atrial fibrillation: Secondary | ICD-10-CM

## 2022-04-15 NOTE — Telephone Encounter (Signed)
Eliquis 5mg  refill request received. Patient is 76 years old, weight-99.4kg, Crea-1.01 on 05/08/21, Diagnosis-Afib, and last seen by Dr. 05/10/21 on 12/18/2021. Dose is appropriate based on dosing criteria. Will send in refill to requested pharmacy.

## 2022-05-15 ENCOUNTER — Other Ambulatory Visit: Payer: Self-pay | Admitting: Cardiology

## 2022-05-15 DIAGNOSIS — I10 Essential (primary) hypertension: Secondary | ICD-10-CM

## 2022-07-09 ENCOUNTER — Other Ambulatory Visit: Payer: Self-pay | Admitting: Cardiology

## 2022-07-09 DIAGNOSIS — I10 Essential (primary) hypertension: Secondary | ICD-10-CM

## 2022-07-09 NOTE — Telephone Encounter (Signed)
Rx refill sent to pharmacy. 

## 2022-08-03 ENCOUNTER — Other Ambulatory Visit: Payer: Self-pay | Admitting: Cardiology

## 2022-08-03 DIAGNOSIS — I4821 Permanent atrial fibrillation: Secondary | ICD-10-CM

## 2022-08-05 NOTE — Telephone Encounter (Signed)
Prescription refill request for Eliquis received. Indication: AF Last office visit: 12/18/21  R Revankar MD Scr: 0.83 on 07/30/22 Age: 76 Weight: 99.4kg  Based on above findings Eliquis 5mg  twice daily is the appropriate dose.  Refill approved.

## 2022-10-17 ENCOUNTER — Encounter: Payer: Self-pay | Admitting: Podiatry

## 2022-10-17 ENCOUNTER — Ambulatory Visit (INDEPENDENT_AMBULATORY_CARE_PROVIDER_SITE_OTHER): Payer: PRIVATE HEALTH INSURANCE | Admitting: Podiatry

## 2022-10-17 DIAGNOSIS — Z7901 Long term (current) use of anticoagulants: Secondary | ICD-10-CM

## 2022-10-17 DIAGNOSIS — Q828 Other specified congenital malformations of skin: Secondary | ICD-10-CM

## 2022-10-17 DIAGNOSIS — E1165 Type 2 diabetes mellitus with hyperglycemia: Secondary | ICD-10-CM

## 2022-10-17 NOTE — Progress Notes (Signed)
Subjective: 77 year old male presents the office with interpreter for follow-up of calluses and diabetic foot check. States he has been doing well and not had much pain in his feet.  Last A1c was 8.3. Denies any systemic complaints such as fevers, chills, nausea, vomiting. No acute changes since last appointment, and no other complaints at this time.   PCP Julio Sicks FNP   He is currently taking Eliquis  Objective: AAO x3, NAD DP/PT pulses palpable bilaterally, CRT less than 3 seconds Hyperkeratotic lesions noted on both the left and right foot.  There are multiple small annular hyperkeratotic lesions there are 2 lesions on each foot.  No pain with calf compression, swelling, warmth, erythema  Assessment: Hyperkeratotic lesions, currently on Eliquis  Plan: -All treatment options discussed with the patient including all alternatives, risks, complications.  -Sharply debrided the hyperkeratotic lesions x 4 without incident Continue offloading as well as moisturizer.   -Return in 6 months for diabetic foot check.  -Patient encouraged to call the office with any questions, concerns, change in symptoms.   Lorenda Peck DPM

## 2022-11-07 ENCOUNTER — Ambulatory Visit: Payer: PRIVATE HEALTH INSURANCE | Attending: Cardiology | Admitting: Cardiology

## 2022-11-07 ENCOUNTER — Encounter: Payer: Self-pay | Admitting: Cardiology

## 2022-11-07 VITALS — BP 154/80 | HR 72 | Ht 62.0 in | Wt 216.1 lb

## 2022-11-07 DIAGNOSIS — I7121 Aneurysm of the ascending aorta, without rupture: Secondary | ICD-10-CM | POA: Diagnosis not present

## 2022-11-07 DIAGNOSIS — I4821 Permanent atrial fibrillation: Secondary | ICD-10-CM | POA: Diagnosis not present

## 2022-11-07 DIAGNOSIS — I10 Essential (primary) hypertension: Secondary | ICD-10-CM | POA: Diagnosis not present

## 2022-11-07 DIAGNOSIS — E1165 Type 2 diabetes mellitus with hyperglycemia: Secondary | ICD-10-CM

## 2022-11-07 DIAGNOSIS — E7800 Pure hypercholesterolemia, unspecified: Secondary | ICD-10-CM

## 2022-11-07 DIAGNOSIS — I2510 Atherosclerotic heart disease of native coronary artery without angina pectoris: Secondary | ICD-10-CM | POA: Diagnosis not present

## 2022-11-07 DIAGNOSIS — Z1321 Encounter for screening for nutritional disorder: Secondary | ICD-10-CM

## 2022-11-07 DIAGNOSIS — I2584 Coronary atherosclerosis due to calcified coronary lesion: Secondary | ICD-10-CM

## 2022-11-07 NOTE — Patient Instructions (Addendum)
Medication Instructions:  Your physician recommends that you continue on your current medications as directed. Please refer to the Current Medication list given to you today.  *If you need a refill on your cardiac medications before your next appointment, please call your pharmacy*   Lab Work: Your physician recommends that you return for lab work in: the next few days for CMP, CBC, TSH, A1C, vitamin D and lipids. You need to have labs done when you are fasting. [facility] lab is located on the 3rd floor, [address]. Hours are Monday - Friday 8 am to 4 pm, closed 11:30 am to 1:00 pm. You do NOT need an appointment.   If you have labs (blood work) drawn today and your tests are completely normal, you will receive your results only by: [place] (if you have MyChart) OR A paper copy in the mail If you have any lab test that is abnormal or we need to change your treatment, we will call you to review the results.   Testing/Procedures: Non-Cardiac CT scanning, (CAT scanning), is a noninvasive, special x-ray that produces cross-sectional images of the body using x-rays and a computer. CT scans help physicians diagnose and treat medical conditions. For some CT exams, a contrast material is used to enhance visibility in the area of the body being studied. CT scans provide greater clarity and reveal more details than regular x-ray exams.  Call to schedule.  [facility] [address] [place], [place] [address] [phone]   Follow-Up: At [facility], you and your health needs are our priority.  As part of our continuing mission to provide you with exceptional heart care, we have created designated Provider Care Teams.  These Care Teams include your primary Cardiologist (physician) and Advanced Practice Providers (APPs -  Physician Assistants and Nurse Practitioners) who all work together to provide you with the care you need, when you need it.  We recommend signing up  for the patient portal called "MyChart".  Sign up information is provided on this After Visit Summary.  MyChart is used to connect with patients for Virtual Visits (Telemedicine).  Patients are able to view lab/test results, encounter notes, upcoming appointments, etc.  Non-urgent messages can be sent to your provider as well.   To learn more about what you can do with MyChart, go to [email or web address].    Your next appointment:   9 month(s)  The format for your next appointment:   In Person  Provider:   [name], MD    Other Instructions  Instrucciones de medicacin: Su mdico recomienda que contine con sus medicamentos actuales segn las indicaciones. Consulte la lista de medicamentos actuales que se le entreg hoy.  *Si necesita un reabastecimiento de sus medicamentos cardacos antes de su prxima cita, llame a su farmacia*   Trabajo de laboratorio: Su mdico recomienda que regrese para realizarse anlisis de laboratorio en: los prximos das para CMP, CBC, TSH, A1C, vitamina D y lpidos. Es necesario que le realicen anlisis de laboratorio cuando est en [place]. El laboratorio [facility] est [address], [address]. El horario es de lunes a viernes, de 8 a. m. a 4 p. m., cerrado de 11:30 a. m. a 1:00 p. m. No necesitas una cita.  Si le han realizado anlisis de sangre (anlisis de [place]) hoy y sus pruebas son completamente normales, recibir sus resultados nicamente mediante:  Mensaje de MyChart (si tiene MyChart) Derek Schroeder  Una copia impresa por correo Derek Schroeder  tiene [place] prueba de laboratorio que es anormal o necesitamos cambiar su tratamiento, lo llamaremos para [employer].   Pruebas/Procedimientos: La exploracin por TC no cardaca (exploracin por TAC) es una radiografa especial no invasiva que produce imgenes transversales del cuerpo utilizando rayos X y [place] computadora. Las tomografas computarizadas ayudan a los mdicos a Retail buyer y Psychologist, clinical. Para algunos exmenes de TC, se utiliza un material de contraste para mejorar la visibilidad en el rea del cuerpo que se estudia. Las tomografas computarizadas brindan mayor claridad y revelan ms detalles que los exmenes de rayos X regulares. Llame para programar.  Punto alto del centro mdico [place], [place] [id number] [phone] Hacer un seguimiento: En [place], usted y sus necesidades de salud son Cleotis Nipper prioridad. Como parte de nuestra misin continua de brindarle una atencin cardaca excepcional, hemos creado equipos de atencin de proveedores designados. Estos equipos de atencin incluyen a su cardilogo principal (mdico) y proveedores de Financial planner (APP: asistentes mdicos y enfermeras practicantes), quienes trabajan juntos para brindarle la atencin que necesita, cuando la necesita.  Recomendamos registrarse en el portal para pacientes llamado "MyChart". La informacin de registro se proporciona en este Resumen posterior a la visita. MyChart se utiliza para conectarse con pacientes para visitas virtuales (telemedicina). Los pacientes pueden [employer] de pruebas/laboratorio, notas de encuentros, prximas citas, etc. Tambin se pueden enviar mensajes no urgentes a su proveedor. Para obtener ms informacin sobre lo que [employer] con MyChart, visite [email or web address].  Tu prxima cita: 9 meses)  El formato para su prxima cita: En persona  Proveedor: Derek Heinz, MD   Important Information About Sugar

## 2022-11-07 NOTE — Progress Notes (Signed)
Cardiology Office Note:    Date:  11/07/2022   ID:  Lane Hacker Dotson, DOB April 11, 1946, MRN [id number]  PCP:  Marcie Mowers, FNP  Cardiologist:  Jenean Lindau, MD   Referring MD: Marcie Mowers, FNP    ASSESSMENT:    1. Primary hypertension   2. Permanent atrial fibrillation ([place])   3. Coronary artery calcification   4. Aneurysm of ascending aorta without rupture (HCC)    PLAN:    In order of problems listed above:  Coronary artery calcification: Secondary prevention stressed with the patient.  Importance of compliance with diet medication stressed any vocalized understanding.  He was advised to walk at least half an hour a day on a daily basis. Ascending aortic aneurysm: He is due for a CT scan on a semiannual basis.  Will refer him to our cardiovascular and thoracic surgery colleagues for monitoring and needful. Essential hypertension: He has an element of whitecoat hypertension.  He checks his pressures at home and they are fine.  He had mentioned to me the readings and they are acceptable. Atrial fibrillation:I discussed with the patient atrial fibrillation, disease process. Management and therapy including rate and rhythm control, anticoagulation benefits and potential risks were discussed extensively with the patient. Patient had multiple questions which were answered to patient's satisfaction. Diabetes mellitus, mixed dyslipidemia and obesity: Lifestyle modification stressed exercise stressed and he will be back in the next few days for complete blood work including hemoglobin A1c. Patient will be seen in follow-up appointment in 6 months or earlier if the patient has any concerns    Medication Adjustments/Labs and Tests Ordered: Current medicines are reviewed at length with the patient today.  Concerns regarding medicines are outlined above.  No orders of the defined types were placed in this encounter.  No orders of the defined types were placed in this  encounter.    No chief complaint on file.    History of Present Illness:    Derek Schroeder is a 77 y.o. male.  Patient has past medical history of ascending aortic aneurysm, atrial fibrillation, essential hypertension and dyslipidemia.  He has history of pulmonary embolism in the past.  He denies any problems at this time and takes care of activities of daily living.  No chest pain orthopnea or PND.  He leads a sedentary lifestyle.  At the time of my evaluation, the patient is alert awake oriented and in no distress.  Past Medical History:  Diagnosis Date   Abdominal aortic aneurysm (AAA) without rupture ([place]) 01/20/2019   Acute bronchitis due to human metapneumovirus 08/19/2020   Acute hypoxemic respiratory failure ([place]) 10/27/2019   Ascending aortic aneurysm (HCC) 05/01/2021   Atrial fibrillation ([place]) 01/07/2019   Bicuspid aortic valve 02/09/2019   CHF (congestive heart failure) ([place]) 06/18/2019   CHF exacerbation (HCC) 10/29/2019   Chronic diastolic heart failure ([place]) 08/19/2020   Coronary artery calcification 05/01/2021   DOE (dyspnea on exertion) 02/08/2021   HTN (hypertension) 01/04/2019   Hypertensive urgency 10/29/2019   Insomnia 01/21/2019   Longstanding persistent atrial fibrillation ([place]) 08/19/2020   Lower extremity edema 06/18/2019   Nonrheumatic aortic valve stenosis 02/09/2019   Obesity (BMI 30.0-34.9) 12/18/2021   Obesity (BMI 35.0-39.9 without comorbidity) 06/09/2020   Permanent atrial fibrillation ([place]) 06/09/2020   Primary hypertension 08/19/2020   Pulmonary embolism on right ([place]) 01/05/2019   Pulmonary nodules 01/20/2019   Pure hypercholesterolemia [id number]   Systolic heart failure ([place]) 01/07/2019   Type 2 diabetes mellitus with hyperglycemia,  without long-term current use of insulin ([place]) 01/04/2019   Unavailability and inaccessibility of other helping agencies 04/21/2019   Uninsured 01/21/2019    Past Surgical History:  Procedure Laterality  Date   NO PAST SURGERIES      Current Medications: Current Meds  Medication Sig   amLODipine (NORVASC) 5 MG tablet Take 1 tablet by mouth once daily   atorvastatin (LIPITOR) 40 MG tablet Take 1 tablet (40 mg total) by mouth daily.   carvedilol (COREG) 12.5 MG tablet Take 1 tablet by mouth twice daily   ELIQUIS 5 MG TABS tablet Take 1 tablet by mouth twice daily   furosemide (LASIX) 80 MG tablet Take 80 mg by mouth 2 (two) times daily.   losartan (COZAAR) 100 MG tablet Take 1 tablet by mouth once daily   metFORMIN (GLUCOPHAGE) 1000 MG tablet Take 1,000 mg by mouth 2 (two) times daily with a meal.   Potassium Chloride ER 20 MEQ TBCR Take 1 tablet by mouth 2 (two) times daily.     Allergies:   Patient has no known allergies.   Social History   Socioeconomic History   Marital status: Married    Spouse name: Not on file   Number of children: Not on file   Years of education: Not on file   Highest education level: Not on file  Occupational History   Not on file  Tobacco Use   Smoking status: Former    Types: Cigarettes    Quit date: 06/09/2016    Years since quitting: 6.4   Smokeless tobacco: Never  Substance and Sexual Activity   Alcohol use: Never   Drug use: Never   Sexual activity: Not on file  Other Topics Concern   Not on file  Social History Narrative   Not on file   Social Determinants of Health   Financial Resource Strain: Not on file  Food Insecurity: Not on file  Transportation Needs: Not on file  Physical Activity: Not on file  Stress: Not on file  Social Connections: Not on file     Family History: The patient's family history includes Diabetes in his father.  ROS:   Please see the history of present illness.    All other systems reviewed and are negative.  EKGs/Labs/Other Studies Reviewed:    The following studies were reviewed today: I discussed my findings with the patient at length.   Recent Labs: No results found for requested labs within  last 365 days.  Recent Lipid Panel    Component Value Date/Time   CHOL 103 05/08/2021 0920   TRIG 84 05/08/2021 0920   HDL 34 (L) 05/08/2021 0920   CHOLHDL 3.0 05/08/2021 0920   LDLCALC 52 05/08/2021 0920    Physical Exam:    VS:  BP (!) 154/80   Pulse 72   Ht '5\' 2"'$  (1.575 m)   Wt 216 lb 1.9 oz (98 kg)   SpO2 98%   BMI 39.53 kg/m     Wt Readings from Last 3 Encounters:  11/07/22 216 lb 1.9 oz (98 kg)  12/18/21 219 lb 1.3 oz (99.4 kg)  05/01/21 227 lb (103 kg)     GEN: Patient is in no acute distress HEENT: Normal NECK: No JVD; No carotid bruits LYMPHATICS: No lymphadenopathy CARDIAC: Hear sounds irregular, 2/6 systolic murmur at the apex. RESPIRATORY:  Clear to auscultation without rales, wheezing or rhonchi  ABDOMEN: Soft, non-tender, non-distended MUSCULOSKELETAL:  No edema; No deformity  SKIN: Warm and dry NEUROLOGIC:  Alert and oriented x 3 PSYCHIATRIC:  Normal affect   Signed, Jenean Lindau, MD  11/07/2022 10:30 AM    [place]

## 2022-11-09 LAB — COMPREHENSIVE METABOLIC PANEL
ALT: 22 IU/L (ref 0–44)
AST: 18 IU/L (ref 0–40)
Albumin/Globulin Ratio: 1.6 (ref 1.2–2.2)
Albumin: 4.4 g/dL (ref 3.8–4.8)
Alkaline Phosphatase: 99 IU/L (ref 44–121)
BUN/Creatinine Ratio: 26 — ABNORMAL HIGH (ref 10–24)
BUN: 24 mg/dL (ref 8–27)
Bilirubin Total: 0.7 mg/dL (ref 0.0–1.2)
CO2: 23 mmol/L (ref 20–29)
Calcium: 9.5 mg/dL (ref 8.6–10.2)
Chloride: 99 mmol/L (ref 96–106)
Creatinine, Ser: 0.93 mg/dL (ref 0.76–1.27)
Globulin, Total: 2.8 g/dL (ref 1.5–4.5)
Glucose: 141 mg/dL — ABNORMAL HIGH (ref 70–99)
Potassium: 4.4 mmol/L (ref 3.5–5.2)
Sodium: 139 mmol/L (ref 134–144)
Total Protein: 7.2 g/dL (ref 6.0–8.5)
eGFR: 86 mL/min/{1.73_m2} (ref >59)

## 2022-11-09 LAB — LIPID PANEL
Chol/HDL Ratio: 2.6 ratio (ref 0.0–5.0)
Cholesterol, Total: 67 mg/dL — ABNORMAL LOW (ref 100–199)
HDL: 26 mg/dL — ABNORMAL LOW (ref >39)
LDL Chol Calc (NIH): 27 mg/dL (ref 0–99)
Triglycerides: 56 mg/dL (ref 0–149)
VLDL Cholesterol Cal: 14 mg/dL (ref 5–40)

## 2022-11-09 LAB — TSH: TSH: 1.45 u[IU]/mL (ref 0.450–4.500)

## 2022-11-09 LAB — CBC
Hematocrit: 39.7 % (ref 37.5–51.0)
Hemoglobin: 12.8 g/dL — ABNORMAL LOW (ref 13.0–17.7)
MCH: 28.7 pg (ref 26.6–33.0)
MCHC: 32.2 g/dL (ref 31.5–35.7)
MCV: 89 fL (ref 79–97)
Platelets: 260 10*3/uL (ref 150–450)
RBC: 4.46 x10E6/uL (ref 4.14–5.80)
RDW: 14.6 % (ref 11.6–15.4)
WBC: 6.2 10*3/uL (ref 3.4–10.8)

## 2022-11-09 LAB — HEMOGLOBIN A1C
Est. average glucose Bld gHb Est-mCnc: 160 mg/dL
Hgb A1c MFr Bld: 7.2 % — ABNORMAL HIGH (ref 4.8–5.6)

## 2022-11-09 LAB — VITAMIN D 25 HYDROXY (VIT D DEFICIENCY, FRACTURES): Vit D, 25-Hydroxy: 31.8 ng/mL (ref 30.0–100.0)

## 2022-11-12 ENCOUNTER — Other Ambulatory Visit: Payer: Self-pay | Admitting: *Deleted

## 2022-11-12 DIAGNOSIS — I4821 Permanent atrial fibrillation: Secondary | ICD-10-CM

## 2022-11-12 MED ORDER — ELIQUIS 5 MG PO TABS: 5.0000 mg | ORAL_TABLET | Freq: Two times a day (BID) | ORAL | 1 refills | Status: DC

## 2022-11-12 NOTE — Telephone Encounter (Signed)
Eliquis '5mg'$  refill request received. Patient is 77 years old, weight-98kg, Crea-0.93 on 11/08/22, Diagnosis-Afib, and last seen by Dr. Geraldo Pitter on 11/07/22. Dose is appropriate based on dosing criteria. Will send in refill to requested pharmacy.

## 2022-11-13 NOTE — Addendum Note (Signed)
Addended by: Truddie Hidden on: 11/13/2022 01:59 PM   Modules accepted: Orders

## 2022-11-14 ENCOUNTER — Encounter: Payer: Self-pay | Admitting: Cardiology

## 2022-11-14 NOTE — Telephone Encounter (Signed)
Derek Schroeder called stating the facility would be out of network for the patient.  They would need clinical notes why the member would need that facility.

## 2022-11-15 NOTE — Telephone Encounter (Signed)
error 

## 2022-11-20 ENCOUNTER — Telehealth: Payer: Self-pay

## 2022-11-20 MED ORDER — ATORVASTATIN CALCIUM 20 MG PO TABS: 20.0000 mg | ORAL_TABLET | Freq: Every day | ORAL | 1 refills | Status: DC

## 2022-11-20 NOTE — Telephone Encounter (Signed)
-----   Message from Jenean Lindau, MD sent at 11/11/2022  9:33 AM EST ----- Reduce statin to half dose.  Liver lipid check and blood work in 6 months.  Copy primary care. Jenean Lindau, MD 11/11/2022 9:33 AM

## 2022-11-20 NOTE — Telephone Encounter (Signed)
Spoke with patient notified of results and recommendations. Verbalized understanding.

## 2022-12-03 ENCOUNTER — Ambulatory Visit (HOSPITAL_BASED_OUTPATIENT_CLINIC_OR_DEPARTMENT_OTHER)
Admission: RE | Admit: 2022-12-03 | Discharge: 2022-12-03 | Disposition: A | Discharge status: Home / Self Care | Payer: PRIVATE HEALTH INSURANCE | Source: Ambulatory Visit | Attending: Cardiology | Admitting: Cardiology

## 2022-12-03 ENCOUNTER — Encounter (HOSPITAL_BASED_OUTPATIENT_CLINIC_OR_DEPARTMENT_OTHER): Payer: Self-pay

## 2022-12-03 DIAGNOSIS — I7121 Aneurysm of the ascending aorta, without rupture: Secondary | ICD-10-CM | POA: Insufficient documentation

## 2022-12-03 MED ORDER — IOHEXOL 350 MG/ML SOLN
100.0000 mL | Freq: Once | INTRAVENOUS | Status: AC | PRN
  Administered 2022-12-03: 100 mL via INTRAVENOUS

## 2022-12-06 ENCOUNTER — Telehealth: Payer: Self-pay

## 2022-12-06 NOTE — Telephone Encounter (Signed)
-----   Message from Truddie Hidden, RN sent at 12/05/2022  5:11 PM EDT ----- Will you call this pt for me please and thank you! ----- Message ----- From: Jenean Lindau, MD Sent: 12/05/2022  11:36 AM EDT To: Truddie Hidden, RN  4.7 cm.  Will need to scan her every 6 months.  Copy to primary care. Jenean Lindau, MD 12/05/2022 11:36 AM

## 2022-12-06 NOTE — Telephone Encounter (Signed)
Patient notified of results and recommendations and agreed with plan, . Results forward to PCP, aware will be contacted when the time is closer to arrange CT. [place] notified.

## 2023-01-14 ENCOUNTER — Other Ambulatory Visit: Payer: Self-pay | Admitting: Cardiology

## 2023-01-14 DIAGNOSIS — I10 Essential (primary) hypertension: Secondary | ICD-10-CM

## 2023-01-29 ENCOUNTER — Other Ambulatory Visit: Payer: Self-pay | Admitting: Cardiology

## 2023-01-29 DIAGNOSIS — I10 Essential (primary) hypertension: Secondary | ICD-10-CM

## 2023-04-17 ENCOUNTER — Ambulatory Visit (INDEPENDENT_AMBULATORY_CARE_PROVIDER_SITE_OTHER): Payer: PRIVATE HEALTH INSURANCE | Admitting: Podiatry

## 2023-04-17 DIAGNOSIS — Z91199 Patient's noncompliance with other medical treatment and regimen due to unspecified reason: Secondary | ICD-10-CM

## 2023-04-17 NOTE — Progress Notes (Signed)
No show

## 2023-04-18 ENCOUNTER — Other Ambulatory Visit: Payer: Self-pay | Admitting: Cardiology

## 2023-04-18 DIAGNOSIS — I10 Essential (primary) hypertension: Secondary | ICD-10-CM

## 2023-05-08 ENCOUNTER — Ambulatory Visit: Payer: BLUE CROSS/BLUE SHIELD | Admitting: Podiatry

## 2023-05-13 ENCOUNTER — Other Ambulatory Visit: Payer: Self-pay | Admitting: Cardiology

## 2023-05-13 NOTE — Telephone Encounter (Signed)
Rx refill sent to pharmacy. 

## 2023-05-28 ENCOUNTER — Other Ambulatory Visit: Payer: Self-pay | Admitting: Cardiology

## 2023-05-28 DIAGNOSIS — I10 Essential (primary) hypertension: Secondary | ICD-10-CM

## 2023-07-13 ENCOUNTER — Other Ambulatory Visit: Payer: Self-pay | Admitting: Cardiology

## 2023-07-13 DIAGNOSIS — I10 Essential (primary) hypertension: Secondary | ICD-10-CM

## 2023-08-09 ENCOUNTER — Other Ambulatory Visit: Payer: Self-pay | Admitting: Cardiology

## 2023-08-09 DIAGNOSIS — I4821 Permanent atrial fibrillation: Secondary | ICD-10-CM

## 2023-08-11 NOTE — Telephone Encounter (Signed)
Prescription refill request for Eliquis received. Indication:afib Last office visit:2/24 Scr:1.03  10/24 Age: 77 Weight:98  kg  Prescription refilled

## 2023-08-22 ENCOUNTER — Ambulatory Visit (INDEPENDENT_AMBULATORY_CARE_PROVIDER_SITE_OTHER): Payer: BLUE CROSS/BLUE SHIELD | Admitting: Podiatry

## 2023-08-22 DIAGNOSIS — Z91199 Patient's noncompliance with other medical treatment and regimen due to unspecified reason: Secondary | ICD-10-CM

## 2023-08-22 NOTE — Progress Notes (Signed)
No show

## 2023-10-02 ENCOUNTER — Other Ambulatory Visit: Payer: Self-pay | Admitting: Cardiology

## 2023-10-02 DIAGNOSIS — I10 Essential (primary) hypertension: Secondary | ICD-10-CM

## 2023-10-08 ENCOUNTER — Other Ambulatory Visit: Payer: Self-pay | Admitting: Cardiology

## 2023-10-08 DIAGNOSIS — I10 Essential (primary) hypertension: Secondary | ICD-10-CM

## 2023-10-23 ENCOUNTER — Other Ambulatory Visit: Payer: Self-pay | Admitting: Cardiology

## 2023-10-23 DIAGNOSIS — I10 Essential (primary) hypertension: Secondary | ICD-10-CM

## 2023-11-04 ENCOUNTER — Other Ambulatory Visit: Payer: Self-pay | Admitting: Cardiology

## 2023-11-08 ENCOUNTER — Other Ambulatory Visit: Payer: Self-pay | Admitting: Cardiology

## 2023-11-08 DIAGNOSIS — I10 Essential (primary) hypertension: Secondary | ICD-10-CM

## 2023-11-17 ENCOUNTER — Other Ambulatory Visit: Payer: Self-pay | Admitting: Cardiology

## 2023-11-17 DIAGNOSIS — I10 Essential (primary) hypertension: Secondary | ICD-10-CM

## 2023-11-24 ENCOUNTER — Other Ambulatory Visit: Payer: Self-pay | Admitting: Cardiology

## 2023-11-24 DIAGNOSIS — I10 Essential (primary) hypertension: Secondary | ICD-10-CM

## 2023-12-07 ENCOUNTER — Other Ambulatory Visit: Payer: Self-pay | Admitting: Cardiology

## 2023-12-07 DIAGNOSIS — I10 Essential (primary) hypertension: Secondary | ICD-10-CM

## 2023-12-08 NOTE — Telephone Encounter (Signed)
 Prescription sent to pharmacy.

## 2023-12-13 ENCOUNTER — Other Ambulatory Visit: Payer: Self-pay | Admitting: Cardiology

## 2023-12-13 DIAGNOSIS — I10 Essential (primary) hypertension: Secondary | ICD-10-CM

## 2024-01-31 ENCOUNTER — Other Ambulatory Visit: Payer: Self-pay | Admitting: Cardiology

## 2024-01-31 DIAGNOSIS — I4821 Permanent atrial fibrillation: Secondary | ICD-10-CM

## 2024-02-03 NOTE — Telephone Encounter (Signed)
 Pt now sees cardiologist at [facility] will deny refill with note to forward to them for refill authorization.

## 2024-08-27 ENCOUNTER — Ambulatory Visit: Admitting: Podiatry

## 2024-08-27 ENCOUNTER — Encounter: Payer: Self-pay | Admitting: Podiatry

## 2024-08-27 DIAGNOSIS — E1165 Type 2 diabetes mellitus with hyperglycemia: Secondary | ICD-10-CM | POA: Diagnosis not present

## 2024-08-27 DIAGNOSIS — Q828 Other specified congenital malformations of skin: Secondary | ICD-10-CM | POA: Diagnosis not present

## 2024-08-27 DIAGNOSIS — E119 Type 2 diabetes mellitus without complications: Secondary | ICD-10-CM

## 2024-08-27 DIAGNOSIS — Z7901 Long term (current) use of anticoagulants: Secondary | ICD-10-CM

## 2024-08-27 DIAGNOSIS — Z0189 Encounter for other specified special examinations: Secondary | ICD-10-CM

## 2024-08-27 NOTE — Progress Notes (Signed)
 Subjective: 78 year old male presents the office with interpreter for follow-up of calluses and diabetic foot check. States he has been doing well and not had much pain in his feet.  Last A1c was 6.6. Denies any systemic complaints such as fevers, chills, nausea, vomiting. No acute changes since last appointment, and no other complaints at this time.   PCP Alexa Cart FNP   He is currently taking Eliquis   Objective: AAO x3, NAD DP/PT pulses palpable bilaterally, CRT less than 3 seconds Hyperkeratotic lesions noted on both the left and right foot.  There are multiple small annular hyperkeratotic lesions there are 2 lesions on each foot.  No pain with calf compression, swelling, warmth, erythema  Assessment: Hyperkeratotic lesions, currently on Eliquis   Plan: -All treatment options discussed with the patient including all alternatives, risks, complications.  -Sharply debrided the hyperkeratotic lesions x 4 without incident Continue offloading as well as moisturizer.   -Return in 6 months for diabetic foot check.  -Patient encouraged to call the office with any questions, concerns, change in symptoms.   [place] Failing DPM

## 2024-11-25 ENCOUNTER — Ambulatory Visit: Admitting: Podiatry
